# Patient Record
Sex: Female | Born: 1985 | Race: White | Hispanic: No | Marital: Married | Smoking: Current some day smoker
Health system: Southern US, Community
[De-identification: ages and names within clinical notes are randomized; demographics above are authoritative.]

## PROBLEM LIST (undated history)

## (undated) ENCOUNTER — Emergency Department (HOSPITAL_BASED_OUTPATIENT_CLINIC_OR_DEPARTMENT_OTHER): Discharge status: Absent without leave | Payer: Medicaid Other

## (undated) DIAGNOSIS — S8990XA Unspecified injury of unspecified lower leg, initial encounter: Secondary | ICD-10-CM

## (undated) DIAGNOSIS — F319 Bipolar disorder, unspecified: Secondary | ICD-10-CM

## (undated) HISTORY — PX: TUBAL LIGATION: SHX77

## (undated) HISTORY — PX: CHOLECYSTECTOMY: SHX55

## (undated) HISTORY — PX: BREAST SURGERY: SHX581

---
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
## 2004-08-24 ENCOUNTER — Other Ambulatory Visit: Admission: RE | Admit: 2004-08-24 | Discharge: 2004-08-24 | Payer: Self-pay | Admitting: Family Medicine

## 2005-03-20 ENCOUNTER — Ambulatory Visit: Payer: Self-pay | Admitting: Family Medicine

## 2005-05-07 ENCOUNTER — Ambulatory Visit: Payer: Self-pay | Admitting: Family Medicine

## 2006-03-20 ENCOUNTER — Ambulatory Visit: Payer: Self-pay | Admitting: Family Medicine

## 2018-08-07 ENCOUNTER — Encounter: Payer: Self-pay | Admitting: Emergency Medicine

## 2018-08-07 ENCOUNTER — Emergency Department (HOSPITAL_BASED_OUTPATIENT_CLINIC_OR_DEPARTMENT_OTHER)
Admission: EM | Admit: 2018-08-07 | Discharge: 2018-08-07 | Disposition: A | Discharge status: Home / Self Care | Payer: Medicaid Other | Attending: Emergency Medicine | Admitting: Emergency Medicine

## 2018-08-07 DIAGNOSIS — F319 Bipolar disorder, unspecified: Secondary | ICD-10-CM | POA: Insufficient documentation

## 2018-08-07 DIAGNOSIS — N898 Other specified noninflammatory disorders of vagina: Secondary | ICD-10-CM | POA: Diagnosis present

## 2018-08-07 DIAGNOSIS — F17200 Nicotine dependence, unspecified, uncomplicated: Secondary | ICD-10-CM | POA: Diagnosis not present

## 2018-08-07 DIAGNOSIS — Z9049 Acquired absence of other specified parts of digestive tract: Secondary | ICD-10-CM | POA: Diagnosis not present

## 2018-08-07 HISTORY — DX: Bipolar disorder, unspecified: F31.9

## 2018-08-07 LAB — URINALYSIS, ROUTINE W REFLEX MICROSCOPIC
Bilirubin Urine: NEGATIVE
GLUCOSE, UA: NEGATIVE mg/dL
HGB URINE DIPSTICK: NEGATIVE
KETONES UR: 15 mg/dL — AB
Nitrite: NEGATIVE
PH: 7 (ref 5.0–8.0)
Protein, ur: NEGATIVE mg/dL
Specific Gravity, Urine: 1.015 (ref 1.005–1.030)

## 2018-08-07 LAB — PREGNANCY, URINE: PREG TEST UR: NEGATIVE

## 2018-08-07 LAB — WET PREP, GENITAL
CLUE CELLS WET PREP: NONE SEEN
Sperm: NONE SEEN
Trich, Wet Prep: NONE SEEN
Yeast Wet Prep HPF POC: NONE SEEN

## 2018-08-07 LAB — URINALYSIS, MICROSCOPIC (REFLEX)

## 2018-08-07 NOTE — Discharge Instructions (Signed)
Someone will call you in a week with the results of her gonorrhea and Chlamydia test.  If you do not hear back you can call the emergency department to follow-up or call your primary care doctor.  If you continue to have vaginal discharge or increased pain please follow-up with your primary doctor or come back to the emergency room.

## 2018-08-07 NOTE — ED Triage Notes (Signed)
PResents with heavy vaginal discharge that began a week ago, she reports that she was treated 2 weeks ago for BV.  Denies abdominal pain and dysuria.

## 2018-08-07 NOTE — ED Provider Notes (Signed)
[facility] EMERGENCY DEPARTMENT Provider Note   CSN: [id number] Arrival date & time: 08/07/18  2058     History   Chief Complaint Chief Complaint  Patient presents with  . Vaginal Discharge    HPI Deborah GurneyHeather Hardy is a 33 y.o. female.  Patient is a 33 year old female with no significant past medical history presenting for vaginal discharge x1 week.  Patient reports the discharge is white, thick, and without odor.  Patient notes some vaginal itching but this improved when she started using Monistat cream.  Patient has a history of recent bacterial vaginosis infection 2 weeks ago for which she was given metronidazole.  Patient says she did not finish the metronidazole because she did not like the taste of the pills.  Says she has 7 days left of this medication.  Patient denies any urinary symptoms she has frequency, urgency, hematuria, dysuria.  Patient denies any vaginal bleeding.  Patient does get regular periods.  Patient is not on any hormonal contraception says she had a tubal ligation 10 years ago.  Patient is sexually active with one partner, most recent unprotected sexual intercourse was 1 week ago.  Patient reports some right sided pain and also pelvic pain.  Patient denies any fevers or chills.  Patient denies any nausea vomiting or diarrhea.  Patient does report a headache and lightheadedness that began today.  Patient recently switched to third shift and has not been sleeping well.  Along with this headache and lightheadedness patient is having vision changes of blurry vision.  Patient denies any alcohol use recently.  Patient does report half pack per day tobacco use.  Patient reports a daily marijuana use most recent was 1 hour ago.     Past Medical History:  Diagnosis Date  . Bipolar 1 disorder (HCC)     There are no active problems to display for this patient.   Past Surgical History:  Procedure Laterality Date  . CHOLECYSTECTOMY    . TUBAL LIGATION       OB  History   None      Home Medications    Prior to Admission medications   Not on File    Family History History reviewed. No pertinent family history.  Social History Social History   Tobacco Use  . Smoking status: Current Every Day Smoker  . Smokeless tobacco: Never Used  Substance Use Topics  . Alcohol use: Yes  . Drug use: Yes    Types: Marijuana     Allergies   Patient has no known allergies.   Review of Systems Review of Systems  Constitutional: Negative for chills and fever.  Eyes: Positive for visual disturbance.  Genitourinary: Positive for pelvic pain and vaginal discharge. Negative for dysuria, flank pain, frequency, hematuria, urgency, vaginal bleeding and vaginal pain.  Musculoskeletal: Positive for back pain and myalgias.  Neurological: Positive for dizziness and headaches.     Physical Exam Updated Vital Signs BP 112/70   Pulse 71   Temp 98.4 F (36.9 C) (Oral)   Resp 16   Ht 5' 1.5" (1.562 m)   Wt 56.2 kg   SpO2 100%   BMI 23.05 kg/m   Physical Exam  Constitutional: She appears well-developed. No distress.  HENT:  Head: Normocephalic.  Mouth/Throat: Oropharynx is clear and moist.  Eyes: Pupils are equal, round, and reactive to light. Conjunctivae and EOM are normal. Right eye exhibits no discharge. Left eye exhibits no discharge. No scleral icterus.  Neck: Normal range of motion.  Cardiovascular: Normal rate, regular rhythm, normal heart sounds and intact distal pulses. Exam reveals no gallop and no friction rub.  No murmur heard. Pulmonary/Chest: Effort normal and breath sounds normal. No respiratory distress. She has no wheezes. She has no rales.  Abdominal: Soft. Bowel sounds are normal. There is no tenderness.  Genitourinary: Uterus normal. Pelvic exam was performed with patient supine. Cervix exhibits no motion tenderness and no friability. Right adnexum displays no mass and no tenderness. Left adnexum displays no mass and no  tenderness. Vaginal discharge found.  Genitourinary Comments: Thick vaginal discharge reminiscent of cream  Musculoskeletal: She exhibits no edema or tenderness.  Neurological: She is alert. No cranial nerve deficit or sensory deficit. She exhibits normal muscle tone. Coordination normal.  No finger-to-nose dysmetria  Skin: Skin is warm. No rash noted.  Psychiatric: She has a normal mood and affect.  Vitals reviewed.    ED Treatments / Results  Labs (all labs ordered are listed, but only abnormal results are displayed) Labs Reviewed  WET PREP, GENITAL - Abnormal; Notable for the following components:      Result Value   WBC, Wet Prep HPF POC MANY (*)    All other components within normal limits  URINALYSIS, ROUTINE W REFLEX MICROSCOPIC - Abnormal; Notable for the following components:   APPearance HAZY (*)    Ketones, ur 15 (*)    Leukocytes, UA TRACE (*)    All other components within normal limits  URINALYSIS, MICROSCOPIC (REFLEX) - Abnormal; Notable for the following components:   Bacteria, UA FEW (*)    All other components within normal limits  PREGNANCY, URINE  GC/CHLAMYDIA PROBE AMP ([place]) NOT AT [facility]    EKG None  Radiology No results found.  Procedures Procedures (including critical care time)  Medications Ordered in ED Medications - No data to display   Initial Impression / Assessment and Plan / ED Course  I have reviewed the triage vital signs and the nursing notes.  Pertinent labs & imaging results that were available during my care of the patient were reviewed by me and considered in my medical decision making (see chart for details).     Patient is a 33 year old female presenting with 1 week of vaginal discharge.  No red flag symptoms present.  Discharge noted on pelvic exam appears to be cream, patient does report using Monistat cream today.  Likely that patient continues to have bacterial vaginosis as she has not completed treatment and has 7  days left of antibiotics.  Cannot rule out gonorrhea and chlamydia given that patient has recent unprotected sexual intercourse.  Will obtain wet prep as well as gonorrhea and Chlamydia testing.  Patient without cervical motion tenderness.  10:33PM: Wet prep negative for Trichomonas, yeast, clue cells.  Gonorrhea and Chlamydia test pending.  Patient chose not to have treatment today and would like to wait for test results.  Patient stable for discharge home.  Return precautions given.  Stable for discharge.  Discussed patient with Dr. Particia Nearing who independently examined patient and agrees with plan.   Final Clinical Impressions(s) / ED Diagnoses   Final diagnoses:  None    ED Discharge Orders    None       Oralia Manis, DO 08/07/18 2234    Jacalyn Lefevre, MD 08/07/18 2320

## 2018-08-11 LAB — GC/CHLAMYDIA PROBE AMP (~~LOC~~) NOT AT ARMC
CHLAMYDIA, DNA PROBE: NEGATIVE
NEISSERIA GONORRHEA: NEGATIVE

## 2018-09-02 ENCOUNTER — Emergency Department (HOSPITAL_BASED_OUTPATIENT_CLINIC_OR_DEPARTMENT_OTHER)
Admission: EM | Admit: 2018-09-02 | Discharge: 2018-09-03 | Disposition: A | Discharge status: Home / Self Care | Payer: Medicaid Other | Attending: Emergency Medicine | Admitting: Emergency Medicine

## 2018-09-02 ENCOUNTER — Encounter (HOSPITAL_BASED_OUTPATIENT_CLINIC_OR_DEPARTMENT_OTHER): Payer: Self-pay | Admitting: Emergency Medicine

## 2018-09-02 ENCOUNTER — Other Ambulatory Visit: Payer: Self-pay

## 2018-09-02 ENCOUNTER — Emergency Department (HOSPITAL_BASED_OUTPATIENT_CLINIC_OR_DEPARTMENT_OTHER): Payer: Medicaid Other

## 2018-09-02 DIAGNOSIS — Y93E8 Activity, other personal hygiene: Secondary | ICD-10-CM | POA: Insufficient documentation

## 2018-09-02 DIAGNOSIS — Y92012 Bathroom of single-family (private) house as the place of occurrence of the external cause: Secondary | ICD-10-CM | POA: Insufficient documentation

## 2018-09-02 DIAGNOSIS — W19XXXA Unspecified fall, initial encounter: Secondary | ICD-10-CM

## 2018-09-02 DIAGNOSIS — M549 Dorsalgia, unspecified: Secondary | ICD-10-CM | POA: Diagnosis not present

## 2018-09-02 DIAGNOSIS — W208XXA Other cause of strike by thrown, projected or falling object, initial encounter: Secondary | ICD-10-CM | POA: Diagnosis not present

## 2018-09-02 DIAGNOSIS — S29012A Strain of muscle and tendon of back wall of thorax, initial encounter: Secondary | ICD-10-CM | POA: Diagnosis not present

## 2018-09-02 DIAGNOSIS — Y999 Unspecified external cause status: Secondary | ICD-10-CM | POA: Insufficient documentation

## 2018-09-02 DIAGNOSIS — W01198A Fall on same level from slipping, tripping and stumbling with subsequent striking against other object, initial encounter: Secondary | ICD-10-CM | POA: Insufficient documentation

## 2018-09-02 DIAGNOSIS — M546 Pain in thoracic spine: Secondary | ICD-10-CM

## 2018-09-02 DIAGNOSIS — S299XXA Unspecified injury of thorax, initial encounter: Secondary | ICD-10-CM | POA: Diagnosis present

## 2018-09-02 DIAGNOSIS — F17200 Nicotine dependence, unspecified, uncomplicated: Secondary | ICD-10-CM | POA: Insufficient documentation

## 2018-09-02 DIAGNOSIS — T148XXA Other injury of unspecified body region, initial encounter: Secondary | ICD-10-CM

## 2018-09-02 MED ORDER — KETOROLAC TROMETHAMINE 15 MG/ML IJ SOLN
15.0000 mg | Freq: Once | INTRAMUSCULAR | Status: AC
  Administered 2018-09-02: 15 mg via INTRAMUSCULAR
  Filled 2018-09-02: qty 1

## 2018-09-02 NOTE — ED Notes (Signed)
ED Provider at bedside. 

## 2018-09-02 NOTE — ED Triage Notes (Signed)
Pt c/o back pain s/p falling in shower this morning; sts ceiling fell in on her, causing her to fall

## 2018-09-03 MED ORDER — NAPROXEN 500 MG PO TABS: 500.0000 mg | ORAL_TABLET | Freq: Two times a day (BID) | ORAL | 0 refills | Status: DC

## 2018-09-03 NOTE — Discharge Instructions (Signed)
Take naproxen 2 times a day with meals.  Do not take other anti-inflammatories at the same time (Advil, Motrin, ibuprofen, Aleve). You may supplement with Tylenol if you need further pain control. Use ice packs or heating pads if this helps control your pain. Use muscle creams (salon pas, icy hot, bengay) for pain.  You will likely have continued muscle stiffness and soreness over the next couple days.  Follow-up with primary care in 1 week if your symptoms are not improving. Return to the emergency room if you develop numbness, loss of bowel or bladder control, inability to walk, or any new or worsening symptoms.

## 2018-09-03 NOTE — ED Provider Notes (Signed)
[facility] EMERGENCY DEPARTMENT Provider Note   CSN: [id number] Arrival date & time: 09/02/18  1954     History   Chief Complaint Chief Complaint  Patient presents with  . Back Pain    HPI Deborah Hardy is a 33 y.o. female for evaluation of back pain.  Patient states she was in the shower this morning when the ceiling fell in on her, causing her to fall backwards and landed on her back.  She thinks she hit her mid back stool in her bathroom.  She denies hitting her head or loss of consciousness.  Since the fall, she has been having increasing back pain.  She has taken Tylenol without improvement of her symptoms.  She has not tried anything else.  She denies headaches, vision changes, slurred speech, neck pain, or pain of the extremities.  She denies numbness or tingling.  She denies loss of bowel or bladder control.  Patient states she has no other medical problems, takes no medications daily.  It is constant, movement makes it worse, nothing makes it better.  No radiation of the pain.  HPI  Past Medical History:  Diagnosis Date  . Bipolar 1 disorder (HCC)     There are no active problems to display for this patient.   Past Surgical History:  Procedure Laterality Date  . CHOLECYSTECTOMY    . TUBAL LIGATION       OB History   None      Home Medications    Prior to Admission medications   Medication Sig Start Date End Date Taking? Authorizing Provider  naproxen (NAPROSYN) 500 MG tablet Take 1 tablet (500 mg total) by mouth 2 (two) times daily with a meal. 09/03/18   Yarithza Mink, PA-C    Family History History reviewed. No pertinent family history.  Social History Social History   Tobacco Use  . Smoking status: Current Every Day Smoker  . Smokeless tobacco: Never Used  Substance Use Topics  . Alcohol use: Yes  . Drug use: Yes    Types: Marijuana     Allergies   Patient has no known allergies.   Review of Systems Review of Systems    Musculoskeletal: Positive for back pain.  Neurological: Negative for numbness.     Physical Exam Updated Vital Signs BP (!) 91/58 (BP Location: Left Arm)   Pulse 66   Temp 98.1 F (36.7 C) (Oral)   Resp 16   Ht 5\' 1"  (1.549 m)   Wt 54.4 kg   SpO2 100%   BMI 22.67 kg/m   Physical Exam  Constitutional: She is oriented to person, place, and time. She appears well-developed and well-nourished. No distress.  Resting comfortably in the bed in no acute distress  HENT:  Head: Normocephalic and atraumatic.  Eyes: EOM are normal.  Neck: Normal range of motion.  Cardiovascular: Normal rate, regular rhythm and intact distal pulses.  Pulmonary/Chest: Effort normal and breath sounds normal. No respiratory distress. She has no wheezes.  Abdominal: Soft. She exhibits no distension. There is no tenderness.  Musculoskeletal: Normal range of motion. She exhibits tenderness. She exhibits no edema or deformity.  Tenderness palpation of mid thoracic back along bilateral back musculature and midline spine.  No tenderness palpation also in the back.  No step-offs or deformities.  Strength intact x4.  Sensation intact x4.  Radial and pedal pulses intact bilaterally.  Full active range of motion of upper and lower extremities without difficulty.  No saddle paresthesias  Neurological: She is alert and oriented to person, place, and time. No sensory deficit.  Skin: Skin is warm. Capillary refill takes less than 2 seconds. No rash noted.  Psychiatric: She has a normal mood and affect.  Nursing note and vitals reviewed.    ED Treatments / Results  Labs (all labs ordered are listed, but only abnormal results are displayed) Labs Reviewed - No data to display  EKG None  Radiology Dg Chest 2 View  Result Date: 09/03/2018 CLINICAL DATA:  Bilateral shoulder pain and low back pain after a fall this morning. Smoker. EXAM: CHEST - 2 VIEW COMPARISON:  None. FINDINGS: The heart size and mediastinal contours  are within normal limits. Both lungs are clear. The visualized skeletal structures are unremarkable. Surgical clips in the right upper quadrant. IMPRESSION: No active cardiopulmonary disease. Electronically Signed   By: Burman Nieves M.D.   On: 09/03/2018 00:00   Dg Cervical Spine Complete  Result Date: 09/02/2018 CLINICAL DATA:  Neck and back pain after fall EXAM: CERVICAL SPINE - COMPLETE 4+ VIEW COMPARISON:  None. FINDINGS: Cervical alignment within normal limits. Mild degenerative change at C3-C4. Vertebral body heights are normal. Dens and lateral masses are within normal limits. Normal prevertebral soft tissue thickness. IMPRESSION: No acute osseous abnormality.  Minimal degenerative change at C3-C4. Electronically Signed   By: Jasmine Pang M.D.   On: 09/02/2018 23:52    Procedures Procedures (including critical care time)  Medications Ordered in ED Medications  ketorolac (TORADOL) 15 MG/ML injection 15 mg (15 mg Intramuscular Given 09/02/18 2255)     Initial Impression / Assessment and Plan / ED Course  I have reviewed the triage vital signs and the nursing notes.  Pertinent labs & imaging results that were available during my care of the patient were reviewed by me and considered in my medical decision making (see chart for details).     Pt presenting for evaluation of back pain after fall.  Physical exam reassuring, no obvious neurologic deficits.  Tenderness palpation of mid thoracic back.  Pain is reproducible with palpation of the musculature, however patient also has pain over midline spine.  Will obtain x-rays for further evaluation.  Toradol given for pain.  On reassessment, patient reports pain is improving.  X-rays read interpreted by me, no fractures or dislocations.  No obvious lung injury.  Discussed with patient.  Discussed typical course of muscle stiffness.  Doubt spinal cord injury, depression, myelopathy.  Doubt cauda equina syndrome.  Discussed treatment with  NSAIDs, muscle creams, heat/ice.  Discussed follow-up with PCP as needed for further evaluation.  At this time, patient appears safe for discharge.  Return precautions given.  Patient states she understands and agrees plan.   Final Clinical Impressions(s) / ED Diagnoses   Final diagnoses:  Fall, initial encounter  Acute bilateral thoracic back pain  Muscle strain    ED Discharge Orders         Ordered    naproxen (NAPROSYN) 500 MG tablet  2 times daily with meals     09/03/18 0011           Tryone Kille, PA-C 09/03/18 0017    Vanetta Mulders, MD 09/09/18 [id number]

## 2018-10-05 ENCOUNTER — Emergency Department (HOSPITAL_BASED_OUTPATIENT_CLINIC_OR_DEPARTMENT_OTHER)
Admission: EM | Admit: 2018-10-05 | Discharge: 2018-10-05 | Disposition: A | Discharge status: Home / Self Care | Payer: Medicaid Other | Attending: Emergency Medicine | Admitting: Emergency Medicine

## 2018-10-05 ENCOUNTER — Other Ambulatory Visit: Payer: Self-pay

## 2018-10-05 ENCOUNTER — Encounter (HOSPITAL_BASED_OUTPATIENT_CLINIC_OR_DEPARTMENT_OTHER): Payer: Self-pay | Admitting: Emergency Medicine

## 2018-10-05 DIAGNOSIS — R110 Nausea: Secondary | ICD-10-CM | POA: Diagnosis not present

## 2018-10-05 DIAGNOSIS — R0981 Nasal congestion: Secondary | ICD-10-CM | POA: Insufficient documentation

## 2018-10-05 DIAGNOSIS — F1210 Cannabis abuse, uncomplicated: Secondary | ICD-10-CM | POA: Diagnosis not present

## 2018-10-05 DIAGNOSIS — B9789 Other viral agents as the cause of diseases classified elsewhere: Secondary | ICD-10-CM | POA: Diagnosis not present

## 2018-10-05 DIAGNOSIS — F17200 Nicotine dependence, unspecified, uncomplicated: Secondary | ICD-10-CM | POA: Diagnosis not present

## 2018-10-05 DIAGNOSIS — R05 Cough: Secondary | ICD-10-CM | POA: Diagnosis present

## 2018-10-05 DIAGNOSIS — J069 Acute upper respiratory infection, unspecified: Secondary | ICD-10-CM | POA: Diagnosis not present

## 2018-10-05 NOTE — ED Triage Notes (Signed)
Pt states she has had a cough, hot flashes then cold chills and nausea  Sxs started 3 days ago

## 2018-10-05 NOTE — ED Provider Notes (Signed)
[facility] EMERGENCY DEPARTMENT Provider Note   CSN: [id number] Arrival date & time: 10/05/18  2138     History   Chief Complaint Chief Complaint  Patient presents with  . Cough  . Nausea    HPI Deborah Hardy is a 33 y.o. female.  Patient c/o non productive cough and nasal congestion in the past 2-3 days. Symptoms moderate, persistent. No sore throat. No trouble breathing or swallowing. No fevers. No chest pain. No nvd. No abd pain. No rash. No headaches. Family member w similar symptoms, no other known ill contacts.   The history is provided by the patient.  Cough  Pertinent negatives include no chest pain, no headaches, no sore throat, no shortness of breath and no eye redness.    Past Medical History:  Diagnosis Date  . Bipolar 1 disorder (HCC)     There are no active problems to display for this patient.   Past Surgical History:  Procedure Laterality Date  . BREAST SURGERY    . CHOLECYSTECTOMY    . TUBAL LIGATION       OB History   None      Home Medications    Prior to Admission medications   Medication Sig Start Date End Date Taking? Authorizing Provider  naproxen (NAPROSYN) 500 MG tablet Take 1 tablet (500 mg total) by mouth 2 (two) times daily with a meal. 09/03/18   Caccavale, Sophia, PA-C    Family History History reviewed. No pertinent family history.  Social History Social History   Tobacco Use  . Smoking status: Current Every Day Smoker  . Smokeless tobacco: Never Used  Substance Use Topics  . Alcohol use: Yes  . Drug use: Yes    Types: Marijuana     Allergies   Patient has no known allergies.   Review of Systems Review of Systems  Constitutional: Negative for fever.  HENT: Positive for congestion. Negative for sore throat.   Eyes: Negative for discharge and redness.  Respiratory: Positive for cough. Negative for shortness of breath.   Cardiovascular: Negative for chest pain.  Gastrointestinal: Negative for  abdominal pain and vomiting.  Genitourinary: Negative for flank pain.  Musculoskeletal: Negative for neck pain and neck stiffness.  Skin: Negative for rash.  Neurological: Negative for headaches.  Hematological: Does not bruise/bleed easily.  Psychiatric/Behavioral: Negative for confusion.     Physical Exam Updated Vital Signs BP (!) 95/59 (BP Location: Left Arm)   Pulse 75   Temp 98.4 F (36.9 C) (Oral)   Resp 16   Ht 1.562 m (5' 1.5")   Wt 54.4 kg   LMP 10/02/2018 (Exact Date)   SpO2 100%   BMI 22.31 kg/m   Physical Exam  Constitutional: She appears well-developed and well-nourished.  HENT:  Mouth/Throat: Oropharynx is clear and moist.  Nasal congestion. tms normal.   Eyes: Conjunctivae are normal. No scleral icterus.  Neck: Neck supple. No tracheal deviation present.  No stiffness or rigidity  Cardiovascular: Normal rate, regular rhythm, normal heart sounds and intact distal pulses. Exam reveals no gallop and no friction rub.  No murmur heard. Pulmonary/Chest: Effort normal and breath sounds normal. No respiratory distress.  Abdominal: Normal appearance. She exhibits no distension. There is no tenderness.  Musculoskeletal: She exhibits no edema.  Lymphadenopathy:    She has no cervical adenopathy.  Neurological: She is alert.  Skin: Skin is warm and dry. No rash noted.  Psychiatric: She has a normal mood and affect.  Nursing note and  vitals reviewed.    ED Treatments / Results  Labs (all labs ordered are listed, but only abnormal results are displayed) Labs Reviewed - No data to display  EKG None  Radiology No results found.  Procedures Procedures (including critical care time)  Medications Ordered in ED Medications - No data to display   Initial Impression / Assessment and Plan / ED Course  I have reviewed the triage vital signs and the nursing notes.  Pertinent labs & imaging results that were available during my care of the patient were  reviewed by me and considered in my medical decision making (see chart for details).  Pts exam/symptoms felt most c/w viral uri.  Pt currently appears stable for d/c.     Final Clinical Impressions(s) / ED Diagnoses   Final diagnoses:  None    ED Discharge Orders    None       Cathren Laine, MD 10/05/18 2253

## 2018-10-05 NOTE — Discharge Instructions (Signed)
It was our pleasure to provide your ER care today - we hope that you feel better.  Rest. Drink plenty of fluids.  Take mucinex or robitussin as need for cough and congestion.  Follow up with primary care doctor in 1 week if symptoms fail to improve/resolve.  Return to ER if worse, new symptoms, increased trouble breathing, other concern.

## 2019-02-06 ENCOUNTER — Emergency Department (HOSPITAL_BASED_OUTPATIENT_CLINIC_OR_DEPARTMENT_OTHER)
Admission: EM | Admit: 2019-02-06 | Discharge: 2019-02-06 | Disposition: A | Discharge status: Home / Self Care | Payer: Medicaid Other | Attending: Emergency Medicine | Admitting: Emergency Medicine

## 2019-02-06 ENCOUNTER — Encounter (HOSPITAL_BASED_OUTPATIENT_CLINIC_OR_DEPARTMENT_OTHER): Payer: Self-pay | Admitting: Adult Health

## 2019-02-06 DIAGNOSIS — F17200 Nicotine dependence, unspecified, uncomplicated: Secondary | ICD-10-CM | POA: Diagnosis not present

## 2019-02-06 DIAGNOSIS — F1210 Cannabis abuse, uncomplicated: Secondary | ICD-10-CM | POA: Diagnosis not present

## 2019-02-06 DIAGNOSIS — R309 Painful micturition, unspecified: Secondary | ICD-10-CM | POA: Insufficient documentation

## 2019-02-06 DIAGNOSIS — N898 Other specified noninflammatory disorders of vagina: Secondary | ICD-10-CM | POA: Insufficient documentation

## 2019-02-06 LAB — URINALYSIS, ROUTINE W REFLEX MICROSCOPIC
BILIRUBIN URINE: NEGATIVE
Glucose, UA: NEGATIVE mg/dL
Hgb urine dipstick: NEGATIVE
KETONES UR: NEGATIVE mg/dL
NITRITE: NEGATIVE
PH: 7 (ref 5.0–8.0)
PROTEIN: NEGATIVE mg/dL
Specific Gravity, Urine: 1.015 (ref 1.005–1.030)

## 2019-02-06 LAB — WET PREP, GENITAL
Clue Cells Wet Prep HPF POC: NONE SEEN
SPERM: NONE SEEN
Yeast Wet Prep HPF POC: NONE SEEN

## 2019-02-06 LAB — URINALYSIS, MICROSCOPIC (REFLEX)

## 2019-02-06 LAB — PREGNANCY, URINE: PREG TEST UR: NEGATIVE

## 2019-02-06 MED ORDER — AZITHROMYCIN 250 MG PO TABS
1000.0000 mg | ORAL_TABLET | Freq: Once | ORAL | Status: AC
  Administered 2019-02-06: 1000 mg via ORAL
  Filled 2019-02-06: qty 4

## 2019-02-06 MED ORDER — OSELTAMIVIR PHOSPHATE 75 MG PO CAPS
75.0000 mg | ORAL_CAPSULE | Freq: Once | ORAL | Status: DC
Start: 2019-02-06 — End: 2019-02-06

## 2019-02-06 MED ORDER — METRONIDAZOLE 500 MG PO TABS
2000.0000 mg | ORAL_TABLET | Freq: Once | ORAL | Status: AC
  Administered 2019-02-06: 2000 mg via ORAL
  Filled 2019-02-06: qty 4

## 2019-02-06 MED ORDER — CEFTRIAXONE SODIUM 250 MG IJ SOLR
250.0000 mg | Freq: Once | INTRAMUSCULAR | Status: AC
  Administered 2019-02-06: 250 mg via INTRAMUSCULAR
  Filled 2019-02-06: qty 250

## 2019-02-06 NOTE — ED Notes (Signed)
C/o vaginal dc x 1 week  Denies any pain

## 2019-02-06 NOTE — ED Triage Notes (Signed)
PT reports vaginal discharge.  The discharge has been for one week. Denies pain in abdomen.

## 2019-02-06 NOTE — ED Provider Notes (Signed)
[facility] EMERGENCY DEPARTMENT Provider Note   CSN: [id number] Arrival date & time: 02/06/19  [id number]     History   Chief Complaint Chief Complaint  Patient presents with  . Vaginal Discharge    HPI Deborah Hardy is a 34 y.o. female presents to emergency department today with chief complaint of vaginal discharge.  She reports symptoms have been present x1 week.  She describes the discharge as thin, with a fishy odor, and a gray color.  She also reports burning with urination.  She has tried drinking cranberry juice, but it did not relieve her symptoms.  Patient has a history of bacterial vaginosis.  She reports this feels similar. She denies any associated pain.  She has not taken anything for symptoms.  Denies fever, abdominal pain, nausea, vomiting, pelvic pain, dyspareunia, rash, genital lesions. She is also concerned for STDs.  She admits to one female partner.    Past Medical History:  Diagnosis Date  . Bipolar 1 disorder (HCC)     There are no active problems to display for this patient.   Past Surgical History:  Procedure Laterality Date  . BREAST SURGERY    . CHOLECYSTECTOMY    . TUBAL LIGATION       OB History   No obstetric history on file.      Home Medications    Prior to Admission medications   Medication Sig Start Date End Date Taking? Authorizing Provider  naproxen (NAPROSYN) 500 MG tablet Take 1 tablet (500 mg total) by mouth 2 (two) times daily with a meal. 09/03/18   Caccavale, Sophia, PA-C    Family History History reviewed. No pertinent family history.  Social History Social History   Tobacco Use  . Smoking status: Current Every Day Smoker  . Smokeless tobacco: Never Used  Substance Use Topics  . Alcohol use: Yes  . Drug use: Yes    Types: Marijuana     Allergies   Patient has no known allergies.   Review of Systems Review of Systems  Constitutional: Negative for chills and fever.  HENT: Negative for congestion, sinus  pressure and sore throat.   Eyes: Negative for pain and visual disturbance.  Respiratory: Negative for chest tightness and shortness of breath.   Cardiovascular: Negative for chest pain and palpitations.  Gastrointestinal: Negative for abdominal pain, diarrhea, nausea and vomiting.  Genitourinary: Positive for vaginal discharge. Negative for difficulty urinating, dyspareunia, genital sores, hematuria, pelvic pain and vaginal bleeding.  Musculoskeletal: Negative for back pain and neck pain.  Skin: Negative for rash and wound.  Neurological: Negative for syncope and headaches.     Physical Exam Updated Vital Signs BP (!) 103/50 (BP Location: Left Arm)   Pulse 72   Temp 98.4 F (36.9 C) (Oral)   Resp 16   Ht 5\' 1"  (1.549 m)   Wt 51 kg   LMP 01/19/2019 (Approximate)   SpO2 100%   BMI 21.24 kg/m   Physical Exam Vitals signs and nursing note reviewed.  Constitutional:      Appearance: She is not ill-appearing or toxic-appearing.  HENT:     Head: Normocephalic and atraumatic.     Nose: Nose normal.     Mouth/Throat:     Mouth: Mucous membranes are moist.     Pharynx: Oropharynx is clear.  Eyes:     General: No scleral icterus.    Conjunctiva/sclera: Conjunctivae normal.  Neck:     Musculoskeletal: Normal range of motion.  Cardiovascular:  Rate and Rhythm: Normal rate and regular rhythm.     Pulses: Normal pulses.     Heart sounds: Normal heart sounds.  Pulmonary:     Effort: Pulmonary effort is normal.     Breath sounds: Normal breath sounds.  Abdominal:     General: There is no distension.     Palpations: Abdomen is soft.     Tenderness: There is no abdominal tenderness. There is no guarding or rebound.  Genitourinary:    Comments: Exam chaperoned by RN. Pelvic exam: normal external genitalia without evidence of trauma. VULVA: normal appearing vulva with no masses, tenderness or lesion. VAGINA: normal appearing vagina with normal color and discharge, no  lesions. CERVIX: normal appearing cervix without lesions, cervical motion tenderness absent, cervical os closed without purulent discharge; vaginal discharge thin and white, Wet prep and DNA probe for chlamydia and GC obtained.   ADNEXA: normal adnexa in size, nontender and no masses   Musculoskeletal: Normal range of motion.  Skin:    General: Skin is warm and dry.     Capillary Refill: Capillary refill takes less than 2 seconds.  Neurological:     Mental Status: She is alert. Mental status is at baseline.     Motor: No weakness.  Psychiatric:        Behavior: Behavior normal.      ED Treatments / Results  Labs (all labs ordered are listed, but only abnormal results are displayed) Labs Reviewed  WET PREP, GENITAL - Abnormal; Notable for the following components:      Result Value   Trich, Wet Prep PRESENT (*)    WBC, Wet Prep HPF POC MODERATE (*)    All other components within normal limits  URINALYSIS, ROUTINE W REFLEX MICROSCOPIC - Abnormal; Notable for the following components:   Leukocytes, UA MODERATE (*)    All other components within normal limits  URINALYSIS, MICROSCOPIC (REFLEX) - Abnormal; Notable for the following components:   Bacteria, UA MANY (*)    All other components within normal limits  URINE CULTURE  PREGNANCY, URINE  RPR  HIV ANTIBODY (ROUTINE TESTING W REFLEX)  GC/CHLAMYDIA PROBE AMP (Center) NOT [facility]    EKG None  Radiology No results found.  Procedures Procedures (including critical care time)  Medications Ordered in ED Medications  cefTRIAXone (ROCEPHIN) injection 250 mg (has no administration in time range)  azithromycin (ZITHROMAX) tablet 1,000 mg (has no administration in time range)  metroNIDAZOLE (FLAGYL) tablet 2,000 mg (has no administration in time range)     Initial Impression / Assessment and Plan / ED Course  I have reviewed the triage vital signs and the nursing notes.  Pertinent labs & imaging results that were  available during my care of the patient were reviewed by me and considered in my medical decision making (see chart for details).   Pt is afebrile, well appearing. Pt presents with concerns for possible STD.  Pt understands that she has GC/Chlamydia cultures pending and that they will need to inform all sexual partners if results return positive. Pt has been treated prophylactically with azithromycin and Rocephin due to pts history, pelvic exam.Wet prep positive for trichomoniasis and moderate white blood cells, treated with metronidazole. Pt not concerning for PID because hemodynamically stable and no cervical motion tenderness on pelvic exam.   UA with moderate leukocytes, 21-50 WBC, and many bacteria. Will send for culture. Patient to be discharged with instructions to follow up with OBGYN/PCP. Discussed importance of using protection  when sexually active. Discussed strict ED return precautions. Pt verbalized understanding of and is in agreement with this plan. Pt stable for discharge home at this time.   Final Clinical Impressions(s) / ED Diagnoses   Final diagnoses:  Vaginal discharge    ED Discharge Orders    None       Sherene Sires, PA-C 02/07/19 0036    Gwyneth Sprout, MD 02/08/19 [phone]

## 2019-02-06 NOTE — Discharge Instructions (Signed)
Your STD testing was positive for Trichomoniasis. We have treated you for that and Gonorrhea, and Chlamydia.  1. Medications: usual home medications 2. Treatment: rest, drink plenty of fluids, use a condom with every sexual encounter 3. Follow Up: Please followup with your primary doctor in 3 days for discussion of your diagnoses and further evaluation after today's visit; if you do not have a primary care doctor use the resource guide provided to find one; You can also follow up with the health department.  Please return to the ER for worsening symptoms, high fevers or persistent vomiting.  You have been tested for HIV, syphilis, chlamydia and gonorrhea.  These results will be available in approximately 3 days.  Please inform all sexual partners if you test positive for any of these diseases.

## 2019-02-07 LAB — RPR: RPR: NONREACTIVE

## 2019-02-07 LAB — HIV ANTIBODY (ROUTINE TESTING W REFLEX): HIV Screen 4th Generation wRfx: NONREACTIVE

## 2019-02-08 LAB — URINE CULTURE: Culture: >=100000 — AB

## 2019-02-09 ENCOUNTER — Other Ambulatory Visit: Payer: Self-pay

## 2019-02-09 ENCOUNTER — Telehealth: Payer: Self-pay

## 2019-02-09 ENCOUNTER — Emergency Department (HOSPITAL_COMMUNITY)
Admission: EM | Admit: 2019-02-09 | Discharge: 2019-02-09 | Discharge status: Against Medical Advice | Payer: Medicaid Other

## 2019-02-09 ENCOUNTER — Encounter (HOSPITAL_BASED_OUTPATIENT_CLINIC_OR_DEPARTMENT_OTHER): Payer: Self-pay | Admitting: *Deleted

## 2019-02-09 DIAGNOSIS — Y999 Unspecified external cause status: Secondary | ICD-10-CM | POA: Insufficient documentation

## 2019-02-09 DIAGNOSIS — S60222A Contusion of left hand, initial encounter: Secondary | ICD-10-CM | POA: Insufficient documentation

## 2019-02-09 DIAGNOSIS — S6992XA Unspecified injury of left wrist, hand and finger(s), initial encounter: Secondary | ICD-10-CM | POA: Diagnosis present

## 2019-02-09 DIAGNOSIS — S62636A Displaced fracture of distal phalanx of right little finger, initial encounter for closed fracture: Secondary | ICD-10-CM | POA: Diagnosis not present

## 2019-02-09 DIAGNOSIS — Y92838 Other recreation area as the place of occurrence of the external cause: Secondary | ICD-10-CM | POA: Diagnosis not present

## 2019-02-09 DIAGNOSIS — Y9389 Activity, other specified: Secondary | ICD-10-CM | POA: Diagnosis not present

## 2019-02-09 LAB — GC/CHLAMYDIA PROBE AMP (~~LOC~~) NOT AT ARMC
Chlamydia: NEGATIVE
NEISSERIA GONORRHEA: NEGATIVE

## 2019-02-09 NOTE — ED Triage Notes (Signed)
Pt called twice from triage with no answer

## 2019-02-09 NOTE — Progress Notes (Signed)
ED Antimicrobial Stewardship Positive Culture Follow Up   Deborah Hardy is an 34 y.o. female who presented to [facility] on 02/06/2019 with a chief complaint of  Chief Complaint  Patient presents with  . Vaginal Discharge    Recent Results (from the past 720 hour(s))  Wet prep, genital     Status: Abnormal   Collection Time: 02/06/19 11:09 AM  Result Value Ref Range Status   Yeast Wet Prep HPF POC NONE SEEN NONE SEEN Final   Trich, Wet Prep PRESENT (A) NONE SEEN Final   Clue Cells Wet Prep HPF POC NONE SEEN NONE SEEN Final   WBC, Wet Prep HPF POC MODERATE (A) NONE SEEN Final   Sperm NONE SEEN  Final    Comment: Performed at [facility], [address] [place], [place] [address]  Urine culture     Status: Abnormal   Collection Time: 02/06/19 12:06 PM  Result Value Ref Range Status   Specimen Description URINE, CLEAN CATCH  Final   Special Requests   Final    NONE Performed at [facility], [address] [place], [place] [address]    Culture (A)  Final    >=100,000 COLONIES/mL ESCHERICHIA COLI >=100,000 COLONIES/mL GROUP B STREP(S.AGALACTIAE)ISOLATED TESTING AGAINST S. AGALACTIAE NOT ROUTINELY PERFORMED DUE TO PREDICTABILITY OF AMP/PEN/VAN SUSCEPTIBILITY.    Report Status 02/08/2019 FINAL  Final   Organism ID, Bacteria ESCHERICHIA COLI (A)  Final      Susceptibility   Escherichia coli - MIC*    AMPICILLIN >=32 RESISTANT Resistant     CEFAZOLIN <=4 SENSITIVE Sensitive     CEFTRIAXONE <=1 SENSITIVE Sensitive     CIPROFLOXACIN >=4 RESISTANT Resistant     GENTAMICIN <=1 SENSITIVE Sensitive     IMIPENEM <=0.25 SENSITIVE Sensitive     NITROFURANTOIN <=16 SENSITIVE Sensitive     TRIMETH/SULFA <=20 SENSITIVE Sensitive     AMPICILLIN/SULBACTAM 16 INTERMEDIATE Intermediate     PIP/TAZO <=4 SENSITIVE Sensitive     Extended ESBL NEGATIVE Sensitive     * >=100,000 COLONIES/mL ESCHERICHIA COLI   Presented with vaginal discharge for 1 week in addition to  burning with urination. Received ceftraixone 250 mg IM, azithromycin 1 g PO, and metronidazole 2 g once. UA showing moderate leukocytes, 21-50 WBC, and many bacteria.   [x]  Patient discharged originally without antimicrobial agent and treatment is now indicated if remains symptomatic   New antibiotic prescription: ONLY IF REMAINS SYMPTOMATIC, cephalexin 500 mg every 12 hours for 5 days  ED Provider: Burna Forts, PA   Sherron Monday, PharmD, BCCCP Clinical Pharmacist  Pager: [phone] Phone: [phone] 02/09/2019, 9:19 AM Clinical Pharmacist Monday - Friday phone -  [phone] Saturday - Sunday phone - [phone]

## 2019-02-09 NOTE — ED Triage Notes (Signed)
States she has pain in her right 5th finger and left hand. States she does not know why she has the pain. Cannot remember if she sustained an injury.

## 2019-02-09 NOTE — Telephone Encounter (Signed)
Post ED Visit - Positive Culture Follow-up: Successful Patient Follow-Up  Culture assessed and recommendations reviewed by:  []  Enzo Bi, Pharm.D. []  Celedonio Miyamoto, Pharm.D., BCPS AQ-ID []  Garvin Fila, Pharm.D., BCPS []  Georgina Pillion, [address].D., BCPS []  Maria Antonia, [address].D., BCPS, AAHIVP []  Estella Husk, Pharm.D., BCPS, AAHIVP []  Lysle Pearl, PharmD, BCPS []  Phillips Climes, PharmD, BCPS []  Agapito Games, PharmD, BCPS []  Verlan Friends, PharmD Adline Mango D Positive urine culture  [x]  Patient discharged without antimicrobial prescription and treatment is now indicated []  Organism is resistant to prescribed ED discharge antimicrobial []  Patient with positive blood cultures  Changes discussed with ED provider: Eyvonne Mechanic [facility] New antibiotic prescription Cephalexin 500 mg BID x 5 days Called to [facility] [phone]  Contacted patient, date 02/09/2019, time 0925   Jerry Caras 02/09/2019, 9:23 AM

## 2019-02-10 ENCOUNTER — Emergency Department (HOSPITAL_BASED_OUTPATIENT_CLINIC_OR_DEPARTMENT_OTHER): Payer: Medicaid Other

## 2019-02-10 ENCOUNTER — Emergency Department (HOSPITAL_BASED_OUTPATIENT_CLINIC_OR_DEPARTMENT_OTHER)
Admission: EM | Admit: 2019-02-10 | Discharge: 2019-02-10 | Disposition: A | Discharge status: Home / Self Care | Payer: Medicaid Other | Attending: Emergency Medicine | Admitting: Emergency Medicine

## 2019-02-10 DIAGNOSIS — S60222A Contusion of left hand, initial encounter: Secondary | ICD-10-CM

## 2019-02-10 DIAGNOSIS — R52 Pain, unspecified: Secondary | ICD-10-CM

## 2019-02-10 DIAGNOSIS — S62636A Displaced fracture of distal phalanx of right little finger, initial encounter for closed fracture: Secondary | ICD-10-CM

## 2019-02-10 MED ORDER — HYDROCODONE-ACETAMINOPHEN 5-325 MG PO TABS
1.0000 | ORAL_TABLET | Freq: Once | ORAL | Status: AC
  Administered 2019-02-10: 1 via ORAL
  Filled 2019-02-10: qty 1

## 2019-02-10 MED ORDER — HYDROCODONE-ACETAMINOPHEN 5-325 MG PO TABS: 1.0000 | ORAL_TABLET | Freq: Four times a day (QID) | ORAL | 0 refills | Status: DC | PRN

## 2019-02-10 MED ORDER — IBUPROFEN 400 MG PO TABS
400.0000 mg | ORAL_TABLET | Freq: Once | ORAL | Status: AC
  Administered 2019-02-10: 400 mg via ORAL
  Filled 2019-02-10: qty 1

## 2019-02-10 NOTE — ED Notes (Signed)
PT states understanding of care given, follow up care, and medication prescribed. PT ambulated from ED to car with a steady gait. 
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
## 2019-02-10 NOTE — ED Notes (Signed)
Patient is A&Ox4.  No signs of distress noted.  Please see providers complete history and physical exam.  

## 2020-06-10 ENCOUNTER — Other Ambulatory Visit: Payer: Self-pay

## 2020-06-10 ENCOUNTER — Emergency Department (HOSPITAL_BASED_OUTPATIENT_CLINIC_OR_DEPARTMENT_OTHER)
Admission: EM | Admit: 2020-06-10 | Discharge: 2020-06-10 | Disposition: A | Discharge status: Home / Self Care | Payer: Medicaid Other | Attending: Emergency Medicine | Admitting: Emergency Medicine

## 2020-06-10 ENCOUNTER — Emergency Department (HOSPITAL_BASED_OUTPATIENT_CLINIC_OR_DEPARTMENT_OTHER): Payer: Medicaid Other

## 2020-06-10 ENCOUNTER — Encounter (HOSPITAL_BASED_OUTPATIENT_CLINIC_OR_DEPARTMENT_OTHER): Payer: Self-pay | Admitting: Emergency Medicine

## 2020-06-10 DIAGNOSIS — Y9372 Activity, wrestling: Secondary | ICD-10-CM | POA: Insufficient documentation

## 2020-06-10 DIAGNOSIS — R5383 Other fatigue: Secondary | ICD-10-CM | POA: Diagnosis not present

## 2020-06-10 DIAGNOSIS — W19XXXA Unspecified fall, initial encounter: Secondary | ICD-10-CM | POA: Diagnosis not present

## 2020-06-10 DIAGNOSIS — F17290 Nicotine dependence, other tobacco product, uncomplicated: Secondary | ICD-10-CM | POA: Insufficient documentation

## 2020-06-10 DIAGNOSIS — S46911A Strain of unspecified muscle, fascia and tendon at shoulder and upper arm level, right arm, initial encounter: Secondary | ICD-10-CM

## 2020-06-10 DIAGNOSIS — F1210 Cannabis abuse, uncomplicated: Secondary | ICD-10-CM | POA: Diagnosis not present

## 2020-06-10 DIAGNOSIS — Y999 Unspecified external cause status: Secondary | ICD-10-CM | POA: Diagnosis not present

## 2020-06-10 DIAGNOSIS — M791 Myalgia, unspecified site: Secondary | ICD-10-CM | POA: Insufficient documentation

## 2020-06-10 DIAGNOSIS — J069 Acute upper respiratory infection, unspecified: Secondary | ICD-10-CM | POA: Diagnosis not present

## 2020-06-10 DIAGNOSIS — R05 Cough: Secondary | ICD-10-CM | POA: Diagnosis not present

## 2020-06-10 DIAGNOSIS — R110 Nausea: Secondary | ICD-10-CM | POA: Insufficient documentation

## 2020-06-10 DIAGNOSIS — Y929 Unspecified place or not applicable: Secondary | ICD-10-CM | POA: Diagnosis not present

## 2020-06-10 MED ORDER — BENZONATATE 100 MG PO CAPS: 100.0000 mg | ORAL_CAPSULE | Freq: Three times a day (TID) | ORAL | 0 refills | Status: DC

## 2020-06-10 MED ORDER — DICLOFENAC SODIUM 1 % EX GEL: 2.0000 g | Freq: Four times a day (QID) | CUTANEOUS | 0 refills | Status: DC
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
## 2020-06-10 NOTE — ED Provider Notes (Signed)
[facility] EMERGENCY DEPARTMENT Provider Note   CSN: [id number] Arrival date & time: 06/10/20  0850     History Chief Complaint  Patient presents with  . Shoulder Pain    Deborah Hardy is a 35 y.o. female with no significant past medical history presenting to the ED with multiple complaints. First complaint is right shoulder pain.  Approximately 1 week ago states that she was wrestling and fell onto her right shoulder.  She has had persistent aching pain since then that only been minimally improved with Tylenol.  States that the pain will radiate to the back of her shoulder and down her right arm.  She is also concerned that "there is a bone sticking out of it."  She denies any prior fracture, dislocations or procedures in the area.  Denies chest pain, numbness or weakness. She also complains of cough for the past 2 days.  Reports cough productive with mucus.  She reports nausea and fatigue but denies any vomiting, shortness of breath, fever, sick contacts with similar symptoms.  HPI     Past Medical History:  Diagnosis Date  . Bipolar 1 disorder ([place])     There are no problems to display for this patient.   Past Surgical History:  Procedure Laterality Date  . BREAST SURGERY    . CHOLECYSTECTOMY    . TUBAL LIGATION       OB History   No obstetric history on file.     No family history on file.  Social History   Tobacco Use  . Smoking status: Current Every Day Smoker    Types: Cigars  . Smokeless tobacco: Never Used  Vaping Use  . Vaping Use: Unknown  Substance Use Topics  . Alcohol use: Yes    Comment: weekly  . Drug use: Yes    Types: Marijuana    Home Medications Prior to Admission medications   Medication Sig Start Date End Date Taking? Authorizing Provider  benzonatate (TESSALON) 100 MG capsule Take 1 capsule (100 mg total) by mouth every 8 (eight) hours. 06/10/20   Gearldine Looney, PA-C  diclofenac Sodium (VOLTAREN) 1 % GEL Apply 2 g  topically 4 (four) times daily. 06/10/20   Abrham Maslowski, PA-C  HYDROcodone-acetaminophen (NORCO/VICODIN) 5-325 MG tablet Take 1 tablet by mouth every 6 (six) hours as needed for severe pain. 02/10/19   Ripley Fraise, MD  naproxen (NAPROSYN) 500 MG tablet Take 1 tablet (500 mg total) by mouth 2 (two) times daily with a meal. 09/03/18   Caccavale, Sophia, PA-C    Allergies    Patient has no known allergies.  Review of Systems   Review of Systems  Constitutional: Positive for fatigue. Negative for appetite change, chills and fever.  HENT: Negative for ear pain, rhinorrhea, sneezing and sore throat.   Eyes: Negative for photophobia and visual disturbance.  Respiratory: Positive for cough. Negative for chest tightness, shortness of breath and wheezing.   Cardiovascular: Negative for chest pain and palpitations.  Gastrointestinal: Negative for abdominal pain, blood in stool, constipation, diarrhea, nausea and vomiting.  Genitourinary: Negative for dysuria, hematuria and urgency.  Musculoskeletal: Positive for arthralgias and myalgias.  Skin: Negative for rash.  Neurological: Negative for dizziness, weakness and light-headedness.    Physical Exam Updated Vital Signs BP 112/64 (BP Location: Right Arm)   Pulse (!) 57   Temp 98.3 F (36.8 C) (Oral)   Resp 16   Ht 5\' 1"  (1.549 m)   Wt 52.9 kg   LMP  05/10/2020   SpO2 100%   BMI 22.03 kg/m   Physical Exam Vitals and nursing note reviewed.  Constitutional:      General: She is not in acute distress.    Appearance: She is well-developed.     Comments: No signs of respiratory distress.  Speaking in complete sentences without difficulty.  HENT:     Head: Normocephalic and atraumatic.     Nose: Nose normal.  Eyes:     General: No scleral icterus.       Left eye: No discharge.     Conjunctiva/sclera: Conjunctivae normal.  Cardiovascular:     Rate and Rhythm: Normal rate and regular rhythm.     Heart sounds: Normal heart sounds. No  murmur heard.  No friction rub. No gallop.   Pulmonary:     Effort: Pulmonary effort is normal. No respiratory distress.     Breath sounds: Normal breath sounds.  Abdominal:     General: Bowel sounds are normal. There is no distension.     Palpations: Abdomen is soft.     Tenderness: There is no abdominal tenderness. There is no guarding.  Musculoskeletal:        General: Tenderness present. Normal range of motion.     Cervical back: Normal range of motion and neck supple.     Comments: Tenderness to palpation of the right shoulder diffusely limited range of motion secondary to pain.  2+ radial pulse palpated bilaterally.  Normal sensation to light touch.  Strength 5/5 in bilateral upper extremities.  Minimal tenderness palpation of the distal right clavicle.  Skin:    General: Skin is warm and dry.     Findings: No rash.  Neurological:     Mental Status: She is alert.     Motor: No abnormal muscle tone.     Coordination: Coordination normal.     ED Results / Procedures / Treatments   Labs (all labs ordered are listed, but only abnormal results are displayed) Labs Reviewed - No data to display  EKG None  Radiology DG Chest 2 View  Result Date: 06/10/2020 CLINICAL DATA:  Right shoulder pain after injury EXAM: CHEST - 2 VIEW COMPARISON:  09/02/2018 FINDINGS: The heart size and mediastinal contours are within normal limits. Both lungs are clear. The visualized skeletal structures are unremarkable. Surgical clips in the right upper quadrant. IMPRESSION: No active cardiopulmonary disease. Electronically Signed   By: Duanne Guess D.O.   On: 06/10/2020 12:29   DG Clavicle Right  Result Date: 06/10/2020 CLINICAL DATA:  Right shoulder and right clavicular pain after wrestling injury EXAM: RIGHT SHOULDER - 2+ VIEW; RIGHT CLAVICLE - 2+ VIEWS COMPARISON:  None. FINDINGS: There is no evidence of fracture or dislocation. Glenohumeral and acromioclavicular joints are within normal limits.  Alignment at the sternoclavicular joint appears unremarkable on frontal views. There is no evidence of arthropathy or other focal bone abnormality. Soft tissues are unremarkable. IMPRESSION: No acute osseous abnormality of the right shoulder or clavicle. Electronically Signed   By: Duanne Guess D.O.   On: 06/10/2020 12:27   DG Shoulder Right  Result Date: 06/10/2020 CLINICAL DATA:  Right shoulder and right clavicular pain after wrestling injury EXAM: RIGHT SHOULDER - 2+ VIEW; RIGHT CLAVICLE - 2+ VIEWS COMPARISON:  None. FINDINGS: There is no evidence of fracture or dislocation. Glenohumeral and acromioclavicular joints are within normal limits. Alignment at the sternoclavicular joint appears unremarkable on frontal views. There is no evidence of arthropathy or other focal bone abnormality.  Soft tissues are unremarkable. IMPRESSION: No acute osseous abnormality of the right shoulder or clavicle. Electronically Signed   By: Duanne Guess D.O.   On: 06/10/2020 12:27    Procedures Procedures (including critical care time)  Medications Ordered in ED Medications - No data to display  ED Course  I have reviewed the triage vital signs and the nursing notes.  Pertinent labs & imaging results that were available during my care of the patient were reviewed by me and considered in my medical decision making (see chart for details).    MDM Rules/Calculators/A&P                          35yo female with no significant past medical history presenting to the ED with multiple complaints. Right shoulder pain for the past week after wrestling.  There is tenderness palpation without any changes to range of motion although reports pain with movement.  No deformities noted.  Areas are vastly intact with normal strength.  X-ray shows no acute abnormalities.  Also reports productive cough for 2 days.  Reports fatigue and nausea.  Denies shortness of breath.  Lungs are clear on my exam.  Oxygen saturations 100%  on room air.  Chest x-ray is unremarkable.  Suspect that symptoms are viral in nature; suspect musculoskeletal cause of shoulder pain, doubt infectious or vascular cause.  Offered sling for shoulder pain and continued range of motion as well as NSAIDs and Voltaren gel.  Will give antitussives to help with cough.   All imaging, if done today, including plain films, CT scans, and ultrasounds, independently reviewed by me, and interpretations confirmed via formal radiology reads.  Patient is hemodynamically stable, in NAD, and able to ambulate in the ED. Evaluation does not show pathology that would require ongoing emergent intervention or inpatient treatment. I explained the diagnosis to the patient. Pain has been managed and has no complaints prior to discharge. Patient is comfortable with above plan and is stable for discharge at this time. All questions were answered prior to disposition. Strict return precautions for returning to the ED were discussed. Encouraged follow up with PCP.   An After Visit Summary was printed and given to the patient.   Portions of this note were generated with Scientist, clinical (histocompatibility and immunogenetics). Dictation errors may occur despite best attempts at proofreading.  Final Clinical Impression(s) / ED Diagnoses Final diagnoses:  Strain of right shoulder, initial encounter  Viral URI with cough    Rx / DC Orders ED Discharge Orders         Ordered    benzonatate (TESSALON) 100 MG capsule  Every 8 hours     Discontinue  Reprint     06/10/20 1257    diclofenac Sodium (VOLTAREN) 1 % GEL  4 times daily     Discontinue  Reprint     06/10/20 1257           Dietrich Pates, PA-C 06/10/20 1258    Pricilla Loveless, MD 06/10/20 1747

## 2020-06-10 NOTE — ED Notes (Signed)
Pt to xray

## 2020-06-10 NOTE — Discharge Instructions (Signed)
Use the Voltaren gel along with Tylenol ibuprofen to help with your symptoms. Take the Tessalon Perles to help with cough. Return to the ER for any worsening cough, shoulder pain, chest pain, shortness of breath.

## 2020-07-29 ENCOUNTER — Other Ambulatory Visit: Payer: Self-pay

## 2020-07-29 ENCOUNTER — Emergency Department (HOSPITAL_BASED_OUTPATIENT_CLINIC_OR_DEPARTMENT_OTHER)
Admission: EM | Admit: 2020-07-29 | Discharge: 2020-07-29 | Disposition: A | Discharge status: Home / Self Care | Payer: Medicaid Other | Attending: Emergency Medicine | Admitting: Emergency Medicine

## 2020-07-29 ENCOUNTER — Encounter (HOSPITAL_BASED_OUTPATIENT_CLINIC_OR_DEPARTMENT_OTHER): Payer: Self-pay | Admitting: Emergency Medicine

## 2020-07-29 DIAGNOSIS — F1590 Other stimulant use, unspecified, uncomplicated: Secondary | ICD-10-CM | POA: Insufficient documentation

## 2020-07-29 DIAGNOSIS — F17290 Nicotine dependence, other tobacco product, uncomplicated: Secondary | ICD-10-CM | POA: Diagnosis not present

## 2020-07-29 DIAGNOSIS — B9689 Other specified bacterial agents as the cause of diseases classified elsewhere: Secondary | ICD-10-CM | POA: Diagnosis not present

## 2020-07-29 DIAGNOSIS — N760 Acute vaginitis: Secondary | ICD-10-CM | POA: Insufficient documentation

## 2020-07-29 DIAGNOSIS — N898 Other specified noninflammatory disorders of vagina: Secondary | ICD-10-CM | POA: Diagnosis present

## 2020-07-29 DIAGNOSIS — Z711 Person with feared health complaint in whom no diagnosis is made: Secondary | ICD-10-CM | POA: Diagnosis not present

## 2020-07-29 LAB — URINALYSIS, ROUTINE W REFLEX MICROSCOPIC
Bilirubin Urine: NEGATIVE
Glucose, UA: NEGATIVE mg/dL
Hgb urine dipstick: NEGATIVE
Ketones, ur: NEGATIVE mg/dL
Leukocytes,Ua: NEGATIVE
Nitrite: NEGATIVE
Protein, ur: NEGATIVE mg/dL
Specific Gravity, Urine: >1.03 — ABNORMAL HIGH (ref 1.005–1.030)
pH: 6 (ref 5.0–8.0)

## 2020-07-29 LAB — PREGNANCY, URINE: Preg Test, Ur: NEGATIVE

## 2020-07-29 LAB — WET PREP, GENITAL
Sperm: NONE SEEN
Trich, Wet Prep: NONE SEEN
Yeast Wet Prep HPF POC: NONE SEEN

## 2020-07-29 MED ORDER — METRONIDAZOLE 500 MG PO TABS: 500.0000 mg | ORAL_TABLET | Freq: Two times a day (BID) | ORAL | 0 refills | Status: DC

## 2020-07-29 MED ORDER — METRONIDAZOLE 500 MG PO TABS: 2000.0000 mg | ORAL_TABLET | Freq: Once | ORAL | Status: DC

## 2020-07-29 MED ORDER — CEFTRIAXONE SODIUM 500 MG IJ SOLR
500.0000 mg | Freq: Once | INTRAMUSCULAR | Status: AC
  Administered 2020-07-29: 500 mg via INTRAMUSCULAR
  Filled 2020-07-29: qty 500

## 2020-07-29 MED ORDER — AZITHROMYCIN 1 G PO PACK
1.0000 g | PACK | Freq: Once | ORAL | Status: AC
  Administered 2020-07-29: 1 g via ORAL
  Filled 2020-07-29: qty 1

## 2020-07-29 NOTE — ED Triage Notes (Signed)
Pt reports vaginal discomfort and discharge x 3 days

## 2020-07-29 NOTE — ED Notes (Signed)
Sudden onset vaginal pain started on  7/27 some discharge noted.

## 2020-07-29 NOTE — ED Provider Notes (Signed)
[facility] Provider Note: Lowella Dell, MD, FACEP  CSN: [id number] MRN: [id number] ARRIVAL: 07/29/20 at 0328 ROOM: [facility]   CHIEF COMPLAINT  Vaginal Discharge   HISTORY OF PRESENT ILLNESS  07/29/20 5:15 AM Deborah Hardy is a 35 y.o. female with 3 days of vaginal discomfort and discharge.  She describes the discomfort as located in her vagina and rates it as a 4 out of 10, worse with sexual intercourse.  The pain is intermittent but came on fairly suddenly first.  She denies vaginal bleeding.  She is here with her husband who is here with some tingling at the end of his penis and is concerned he may have an STD.  We went ahead and treated him given his symptomatology.   Past Medical History:  Diagnosis Date  . Bipolar 1 disorder [facility])     Past Surgical History:  Procedure Laterality Date  . BREAST SURGERY    . CHOLECYSTECTOMY    . TUBAL LIGATION      No family history on file.  Social History   Tobacco Use  . Smoking status: Current Every Day Smoker    Types: Cigars  . Smokeless tobacco: Never Used  Vaping Use  . Vaping Use: Unknown  Substance Use Topics  . Alcohol use: Yes    Comment: weekly  . Drug use: Yes    Types: Marijuana    Prior to Admission medications   Medication Sig Start Date End Date Taking? Authorizing Provider  metroNIDAZOLE (FLAGYL) 500 MG tablet Take 1 tablet (500 mg total) by mouth 2 (two) times daily. One po bid x 7 days 07/29/20   Rochester Serpe, MD    Allergies Patient has no known allergies.   REVIEW OF SYSTEMS  Negative except as noted here or in the History of Present Illness.   PHYSICAL EXAMINATION  Initial Vital Signs Blood pressure (!) 101/63, pulse 62, temperature 98.4 F (36.9 C), temperature source Oral, resp. rate 16, height 5\' 1"  (1.549 m), weight 52.2 kg, SpO2 100 %.  Examination General: Well-developed, well-nourished female in no acute distress; appearance consistent with age of record HENT:  normocephalic; atraumatic Eyes: pupils equal, round and reactive to light; extraocular muscles intact Neck: supple Heart: regular rate and rhythm; no murmurs, rubs or gallops Lungs: clear to auscultation bilaterally Abdomen: soft; nondistended; nontender; bowel sounds present GU: Tanner V female; whitish following vaginal discharge; no vaginal bleeding; no cervical motion tenderness; no vulvovaginal tenderness; no adnexal tenderness Extremities: No deformity; full range of motion; pulses normal Neurologic: Awake, alert and oriented; motor function intact in all extremities and symmetric; no facial droop Skin: Warm and dry Psychiatric: Normal mood and affect   RESULTS  Summary of this visit's results, reviewed and interpreted by myself:   EKG Interpretation  Date/Time:    Ventricular Rate:    PR Interval:    QRS Duration:   QT Interval:    QTC Calculation:   R Axis:     Text Interpretation:        Laboratory Studies: Results for orders placed or performed during the hospital encounter of 07/29/20 (from the past 24 hour(s))  Urinalysis, Routine w reflex microscopic     Status: Abnormal   Collection Time: 07/29/20  3:45 AM  Result Value Ref Range   Color, Urine YELLOW YELLOW   APPearance HAZY (A) CLEAR   Specific Gravity, Urine >1.030 (H) 1.005 - 1.030   pH 6.0 5.0 - 8.0   Glucose, UA NEGATIVE NEGATIVE mg/dL  Hgb urine dipstick NEGATIVE NEGATIVE   Bilirubin Urine NEGATIVE NEGATIVE   Ketones, ur NEGATIVE NEGATIVE mg/dL   Protein, ur NEGATIVE NEGATIVE mg/dL   Nitrite NEGATIVE NEGATIVE   Leukocytes,Ua NEGATIVE NEGATIVE  Pregnancy, urine     Status: None   Collection Time: 07/29/20  3:45 AM  Result Value Ref Range   Preg Test, Ur NEGATIVE NEGATIVE  Wet prep, genital     Status: Abnormal   Collection Time: 07/29/20  5:16 AM  Result Value Ref Range   Yeast Wet Prep HPF POC NONE SEEN NONE SEEN   Trich, Wet Prep NONE SEEN NONE SEEN   Clue Cells Wet Prep HPF POC PRESENT (A)  NONE SEEN   WBC, Wet Prep HPF POC FEW (A) NONE SEEN   Sperm NONE SEEN    Imaging Studies: No results found.  ED COURSE and MDM  Nursing notes, initial and subsequent vitals signs, including pulse oximetry, reviewed and interpreted by myself.  Vitals:   07/29/20 0339 07/29/20 0342  BP:  (!) 101/63  Pulse:  62  Resp:  16  Temp:  98.4 F (36.9 C)  TempSrc:  Oral  SpO2:  100%  Weight: 52.2 kg   Height: 5\' 1"  (1.549 m)    Medications  cefTRIAXone (ROCEPHIN) injection 500 mg (has no administration in time range)  azithromycin (ZITHROMAX) powder 1 g (has no administration in time range)    We will treat for BV due to presence of clue cells and symptomatic vaginal discharge.  We will also treat for gonorrhea and chlamydia given that her husband was treated for the same.  She was advised of this and the need to be abstinent for 1 week after treatment.  PROCEDURES  Procedures   ED DIAGNOSES     ICD-10-CM   1. BV (bacterial vaginosis)  N76.0    B96.89   2. Concern about STD in female without diagnosis  Z71.1        Lanette Ell, , MD 07/29/20 [phone]

## 2020-08-01 LAB — GC/CHLAMYDIA PROBE AMP (~~LOC~~) NOT AT ARMC
Chlamydia: NEGATIVE
Comment: NEGATIVE
Comment: NORMAL
Neisseria Gonorrhea: NEGATIVE

## 2021-01-13 ENCOUNTER — Emergency Department (HOSPITAL_BASED_OUTPATIENT_CLINIC_OR_DEPARTMENT_OTHER)
Admission: EM | Admit: 2021-01-13 | Discharge: 2021-01-13 | Disposition: A | Discharge status: Home / Self Care | Payer: Medicaid Other | Attending: Emergency Medicine | Admitting: Emergency Medicine

## 2021-01-13 ENCOUNTER — Other Ambulatory Visit: Payer: Self-pay

## 2021-01-13 ENCOUNTER — Emergency Department (HOSPITAL_BASED_OUTPATIENT_CLINIC_OR_DEPARTMENT_OTHER): Payer: Medicaid Other

## 2021-01-13 ENCOUNTER — Encounter (HOSPITAL_BASED_OUTPATIENT_CLINIC_OR_DEPARTMENT_OTHER): Payer: Self-pay | Admitting: *Deleted

## 2021-01-13 DIAGNOSIS — Y9389 Activity, other specified: Secondary | ICD-10-CM | POA: Insufficient documentation

## 2021-01-13 DIAGNOSIS — Y9269 Other specified industrial and construction area as the place of occurrence of the external cause: Secondary | ICD-10-CM | POA: Diagnosis not present

## 2021-01-13 DIAGNOSIS — F17290 Nicotine dependence, other tobacco product, uncomplicated: Secondary | ICD-10-CM | POA: Diagnosis not present

## 2021-01-13 DIAGNOSIS — S22000A Wedge compression fracture of unspecified thoracic vertebra, initial encounter for closed fracture: Secondary | ICD-10-CM | POA: Diagnosis not present

## 2021-01-13 DIAGNOSIS — W52XXXA Crushed, pushed or stepped on by crowd or human stampede, initial encounter: Secondary | ICD-10-CM | POA: Diagnosis not present

## 2021-01-13 DIAGNOSIS — M546 Pain in thoracic spine: Secondary | ICD-10-CM | POA: Diagnosis present

## 2021-01-13 DIAGNOSIS — R52 Pain, unspecified: Secondary | ICD-10-CM

## 2021-01-13 MED ORDER — HYDROCODONE-ACETAMINOPHEN 5-325 MG PO TABS
1.0000 | ORAL_TABLET | Freq: Once | ORAL | Status: AC
  Administered 2021-01-13: 1 via ORAL
  Filled 2021-01-13: qty 1

## 2021-01-13 MED ORDER — HYDROCODONE-ACETAMINOPHEN 5-325 MG PO TABS: 1.0000 | ORAL_TABLET | Freq: Four times a day (QID) | ORAL | 0 refills | Status: DC | PRN

## 2021-01-13 NOTE — ED Notes (Signed)
In to round on client, warm blanket provided for comfort measures, explained radiological studies ordered.

## 2021-01-13 NOTE — ED Notes (Signed)
AVS reviewed with pt and reinforced information from PA, provided pt with work note as requested due to her job and that Neuro MD is recommending limited lifting and bending. Also informed pt of Rx for pain med had been sent to her pharmacy electronically, discussed safety while taking PO opioids. Opportunity for questions provided prior to leaving.
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
## 2021-01-13 NOTE — ED Notes (Signed)
Remains in ED for further evaluation of radiological findings (questionable compression fx) PA requesting neurological consult

## 2021-01-13 NOTE — Discharge Instructions (Signed)
Your CT scan x-ray shows that you have 3 compression fractures in your thoracic spine. I spoke to the oncall neurosurgeon, Dr. Maisie Fus, who recommends pain control and following up in his office in 4 weeks. He recommends avoiding excessive bending, twisting and avoiding lifting more than 15-20 lbs until you follow up with him for repeat imaging. Return to the ED sooner for additional injuries, worsening pain, numbness or weakness in arms, chest pain.

## 2022-02-04 ENCOUNTER — Encounter (HOSPITAL_COMMUNITY): Payer: Self-pay | Admitting: Emergency Medicine

## 2022-02-04 ENCOUNTER — Other Ambulatory Visit: Payer: Self-pay

## 2022-02-04 ENCOUNTER — Ambulatory Visit (HOSPITAL_COMMUNITY)
Admission: EM | Admit: 2022-02-04 | Discharge: 2022-02-04 | Disposition: A | Discharge status: Home / Self Care | Payer: Worker's Compensation

## 2022-02-04 DIAGNOSIS — M546 Pain in thoracic spine: Secondary | ICD-10-CM | POA: Diagnosis not present

## 2022-02-04 MED ORDER — MELOXICAM 15 MG PO TABS: 15.0000 mg | ORAL_TABLET | Freq: Every day | ORAL | 0 refills | Status: DC

## 2022-02-04 MED ORDER — TIZANIDINE HCL 4 MG PO TABS: 4.0000 mg | ORAL_TABLET | Freq: Four times a day (QID) | ORAL | 0 refills | Status: DC | PRN
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
## 2022-08-14 ENCOUNTER — Ambulatory Visit (HOSPITAL_COMMUNITY)
Admission: EM | Admit: 2022-08-14 | Discharge: 2022-08-14 | Disposition: A | Discharge status: Home / Self Care | Payer: Medicaid Other | Attending: Family Medicine | Admitting: Family Medicine

## 2022-08-14 ENCOUNTER — Encounter (HOSPITAL_COMMUNITY): Payer: Self-pay | Admitting: *Deleted

## 2022-08-14 DIAGNOSIS — Z202 Contact with and (suspected) exposure to infections with a predominantly sexual mode of transmission: Secondary | ICD-10-CM | POA: Diagnosis present

## 2022-08-14 LAB — HIV ANTIBODY (ROUTINE TESTING W REFLEX): HIV Screen 4th Generation wRfx: NONREACTIVE
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
## 2022-08-14 NOTE — ED Provider Notes (Signed)
[facility]    CSN: [id number] Arrival date & time: 08/14/22  1212      History   Chief Complaint Chief Complaint  Patient presents with   Exposure to STD    HPI Deborah Hardy is a 37 y.o. female.    Exposure to STD   Here for possible exposure to HIV.  She just broke up with someone whom she had been dating for about a year.  She states she had done STD screening sometime in 2022 and it was negative.  This was after she had started seeing this person.  Someone has just texted her and told her that her former partner is positive for HIV.  This patient has not had any vaginal discharge or abdominal pain or cough or fever or night sweats or weight loss  Past Medical History:  Diagnosis Date   Bipolar 1 disorder (HCC)     There are no problems to display for this patient.   Past Surgical History:  Procedure Laterality Date   BREAST SURGERY     CHOLECYSTECTOMY     TUBAL LIGATION      OB History   No obstetric history on file.      Home Medications    Prior to Admission medications   Medication Sig Start Date End Date Taking? Authorizing Provider  meloxicam (MOBIC) 15 MG tablet Take 1 tablet (15 mg total) by mouth daily. 02/04/22   Rushie Chestnut, PA-C  metroNIDAZOLE (FLAGYL) 500 MG tablet Take 1 tablet (500 mg total) by mouth 2 (two) times daily. One po bid x 7 days 07/29/20   Molpus, John, MD  naproxen (NAPROSYN) 375 MG tablet Take 375 mg by mouth 2 (two) times daily as needed. 02/03/22   [provider]  tiZANidine (ZANAFLEX) 4 MG tablet Take 1 tablet (4 mg total) by mouth every 6 (six) hours as needed for muscle spasms. 02/04/22   Rushie Chestnut, PA-C    Family History History reviewed. No pertinent family history.  Social History Social History   Tobacco Use   Smoking status: Former    Types: Cigars   Smokeless tobacco: Never  Vaping Use   Vaping Use: Every day  Substance Use Topics   Alcohol use: Yes    Comment:  occasionally   Drug use: Yes    Types: Marijuana     Allergies   Latex and Penicillins   Review of Systems Review of Systems   Physical Exam Triage Vital Signs ED Triage Vitals  Enc Vitals Group     BP 08/14/22 1251 92/67     Pulse Rate 08/14/22 1251 70     Resp 08/14/22 1251 16     Temp 08/14/22 1251 98.2 F (36.8 C)     Temp Source 08/14/22 1251 Oral     SpO2 08/14/22 1251 98 %     Weight --      Height --      Head Circumference --      Peak Flow --      Pain Score 08/14/22 1252 0     Pain Loc --      Pain Edu? --      Excl. in GC? --    No data found.  Updated Vital Signs BP 92/67   Pulse 70   Temp 98.2 F (36.8 C) (Oral)   Resp 16   LMP 08/05/2022 (Exact Date)   SpO2 98%   Visual Acuity Right Eye Distance:  Left Eye Distance:   Bilateral Distance:    Right Eye Near:   Left Eye Near:    Bilateral Near:     Physical Exam Vitals reviewed.  Constitutional:      General: She is not in acute distress.    Appearance: She is not ill-appearing, toxic-appearing or diaphoretic.  Cardiovascular:     Rate and Rhythm: Normal rate and regular rhythm.  Pulmonary:     Effort: Pulmonary effort is normal.     Breath sounds: Normal breath sounds.  Neurological:     Mental Status: She is alert.  Psychiatric:        Behavior: Behavior normal.      UC Treatments / Results  Labs (all labs ordered are listed, but only abnormal results are displayed) Labs Reviewed - No data to display  EKG   Radiology No results found.  Procedures Procedures (including critical care time)  Medications Ordered in UC Medications - No data to display  Initial Impression / Assessment and Plan / UC Course  I have reviewed the triage vital signs and the nursing notes.  Pertinent labs & imaging results that were available during my care of the patient were reviewed by me and considered in my medical decision making (see chart for details).     Lab drawn for HIV and  RPR, and self vaginal swab was done.  We will notify her of any positives and treat per protocol Final Clinical Impressions(s) / UC Diagnoses   Final diagnoses:  None   Discharge Instructions   None    ED Prescriptions   None    PDMP not reviewed this encounter.   Zenia Resides, MD 08/14/22 [phone]

## 2022-08-14 NOTE — Discharge Instructions (Addendum)
Staff will notify you if there is any positive test on your blood work or swab  You can also check MyChart

## 2022-08-15 ENCOUNTER — Telehealth (HOSPITAL_COMMUNITY): Payer: Self-pay | Admitting: Emergency Medicine

## 2022-08-15 LAB — CERVICOVAGINAL ANCILLARY ONLY
Bacterial Vaginitis (gardnerella): POSITIVE — AB
Candida Glabrata: NEGATIVE
Candida Vaginitis: NEGATIVE
Chlamydia: NEGATIVE
Comment: NEGATIVE
Comment: NEGATIVE
Comment: NEGATIVE
Comment: NEGATIVE
Comment: NEGATIVE
Comment: NORMAL
Neisseria Gonorrhea: NEGATIVE
Trichomonas: POSITIVE — AB

## 2022-08-15 LAB — RPR: RPR Ser Ql: NONREACTIVE

## 2022-08-15 MED ORDER — METRONIDAZOLE 500 MG PO TABS: 500.0000 mg | ORAL_TABLET | Freq: Two times a day (BID) | ORAL | 0 refills | Status: DC
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
## 2022-10-10 ENCOUNTER — Ambulatory Visit (HOSPITAL_COMMUNITY)
Admission: EM | Admit: 2022-10-10 | Discharge: 2022-10-10 | Disposition: A | Discharge status: Home / Self Care | Payer: Medicaid Other | Attending: Family Medicine | Admitting: Family Medicine

## 2022-10-10 ENCOUNTER — Encounter (HOSPITAL_COMMUNITY): Payer: Self-pay

## 2022-10-10 DIAGNOSIS — N898 Other specified noninflammatory disorders of vagina: Secondary | ICD-10-CM | POA: Diagnosis not present

## 2022-10-10 DIAGNOSIS — M25561 Pain in right knee: Secondary | ICD-10-CM | POA: Insufficient documentation

## 2022-10-10 DIAGNOSIS — M79604 Pain in right leg: Secondary | ICD-10-CM | POA: Insufficient documentation

## 2022-10-10 HISTORY — DX: Unspecified injury of unspecified lower leg, initial encounter: S89.90XA

## 2022-10-10 MED ORDER — PREDNISONE 20 MG PO TABS: 40.0000 mg | ORAL_TABLET | Freq: Every day | ORAL | 0 refills | Status: DC

## 2022-10-10 MED ORDER — METRONIDAZOLE 500 MG PO TABS: 500.0000 mg | ORAL_TABLET | Freq: Two times a day (BID) | ORAL | 0 refills | Status: DC
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
## 2022-10-10 NOTE — Discharge Instructions (Signed)
We have sent testing for various causes of infections. We will notify you of any positive results once they are received. If required, we will prescribe any medications you might need.

## 2022-10-11 LAB — CERVICOVAGINAL ANCILLARY ONLY
Bacterial Vaginitis (gardnerella): POSITIVE — AB
Candida Glabrata: NEGATIVE
Candida Vaginitis: NEGATIVE
Chlamydia: NEGATIVE
Comment: NEGATIVE
Comment: NEGATIVE
Comment: NEGATIVE
Comment: NEGATIVE
Comment: NEGATIVE
Comment: NORMAL
Neisseria Gonorrhea: NEGATIVE
Trichomonas: POSITIVE — AB

## 2023-07-16 ENCOUNTER — Emergency Department (HOSPITAL_BASED_OUTPATIENT_CLINIC_OR_DEPARTMENT_OTHER)
Admission: EM | Admit: 2023-07-16 | Discharge: 2023-07-16 | Disposition: A | Discharge status: Home / Self Care | Payer: 59 | Attending: Emergency Medicine | Admitting: Emergency Medicine

## 2023-07-16 ENCOUNTER — Other Ambulatory Visit: Payer: Self-pay

## 2023-07-16 DIAGNOSIS — B9689 Other specified bacterial agents as the cause of diseases classified elsewhere: Secondary | ICD-10-CM | POA: Insufficient documentation

## 2023-07-16 DIAGNOSIS — Z91040 Latex allergy status: Secondary | ICD-10-CM | POA: Diagnosis not present

## 2023-07-16 DIAGNOSIS — A5901 Trichomonal vulvovaginitis: Secondary | ICD-10-CM | POA: Diagnosis not present

## 2023-07-16 DIAGNOSIS — L739 Follicular disorder, unspecified: Secondary | ICD-10-CM | POA: Diagnosis not present

## 2023-07-16 DIAGNOSIS — A599 Trichomoniasis, unspecified: Secondary | ICD-10-CM | POA: Insufficient documentation

## 2023-07-16 DIAGNOSIS — N898 Other specified noninflammatory disorders of vagina: Secondary | ICD-10-CM | POA: Diagnosis not present

## 2023-07-16 DIAGNOSIS — N760 Acute vaginitis: Secondary | ICD-10-CM | POA: Insufficient documentation

## 2023-07-16 DIAGNOSIS — N390 Urinary tract infection, site not specified: Secondary | ICD-10-CM | POA: Diagnosis not present

## 2023-07-16 LAB — PREGNANCY, URINE: Preg Test, Ur: NEGATIVE

## 2023-07-16 LAB — URINALYSIS, ROUTINE W REFLEX MICROSCOPIC
Bilirubin Urine: NEGATIVE
Glucose, UA: NEGATIVE mg/dL
Hgb urine dipstick: NEGATIVE
Ketones, ur: NEGATIVE mg/dL
Leukocytes,Ua: NEGATIVE
Nitrite: POSITIVE — AB
Protein, ur: NEGATIVE mg/dL
Specific Gravity, Urine: 1.025 (ref 1.005–1.030)
pH: 6 (ref 5.0–8.0)

## 2023-07-16 LAB — URINALYSIS, MICROSCOPIC (REFLEX)

## 2023-07-16 LAB — WET PREP, GENITAL
Sperm: NONE SEEN
WBC, Wet Prep HPF POC: <10 (ref <10)
Yeast Wet Prep HPF POC: NONE SEEN

## 2023-07-16 LAB — HIV ANTIBODY (ROUTINE TESTING W REFLEX): HIV Screen 4th Generation wRfx: NONREACTIVE

## 2023-07-16 MED ORDER — CEPHALEXIN 500 MG PO CAPS: 500.0000 mg | ORAL_CAPSULE | Freq: Four times a day (QID) | ORAL | 0 refills | Status: DC

## 2023-07-16 MED ORDER — METRONIDAZOLE 500 MG PO TABS: 500.0000 mg | ORAL_TABLET | Freq: Two times a day (BID) | ORAL | 0 refills | Status: DC

## 2023-07-16 NOTE — Discharge Instructions (Signed)
Your workup today showed you have trichomonas, and bacterial vaginosis infection.  Antibiotics sent for these.  He also reports some burning.  Urine showed potential signs of infection.  Antibiotic sent for this as well.  For any concerning symptoms return to the emergency room otherwise follow-up with your primary care provider.

## 2023-07-16 NOTE — ED Triage Notes (Signed)
C/O white vaginal discharge and a couple of bumps that might be infected.

## 2023-07-16 NOTE — ED Provider Notes (Signed)
[place] [facility] Provider Note   CSN: [id number] Arrival date & time: 07/16/23  1404     History  Chief Complaint  Patient presents with   SEXUALLY TRANSMITTED DISEASE    Deborah Hardy is a 38 y.o. female.  38 year old female presents today for concern of STD exposure.  She states for the past 2 weeks she has noticed vaginal discharge.  No pelvic pain, dysuria, or flank pain.  Denies any recent change in sexual partner.  But states she did have unprotected sex recently.  States she is in a monogamous relationship.  She is also concerned that she may have folliculitis.  She states she has had drainage from lesions if she presses on them.  Otherwise no fever.  The history is provided by the patient. No language interpreter was used.       Home Medications Prior to Admission medications   Medication Sig Start Date End Date Taking? Authorizing Provider  metroNIDAZOLE (FLAGYL) 500 MG tablet Take 1 tablet (500 mg total) by mouth 2 (two) times daily. 10/10/22   Mardella Layman, MD  predniSONE (DELTASONE) 20 MG tablet Take 2 tablets (40 mg total) by mouth daily. 10/10/22   Mardella Layman, MD      Allergies    Latex and Penicillins    Review of Systems   Review of Systems  Constitutional:  Negative for fever.  Genitourinary:  Positive for vaginal discharge. Negative for dysuria, flank pain, pelvic pain, vaginal bleeding and vaginal pain.  Skin:  Positive for rash (Folliculitis over pubic region).  All other systems reviewed and are negative.   Physical Exam Updated Vital Signs BP 114/88 (BP Location: Right Arm)   Pulse (!) 59   Temp 98.2 F (36.8 C)   Resp 18   Ht 5\' 1"  (1.549 m)   Wt 54 kg   SpO2 100%   BMI 22.48 kg/m  Physical Exam Vitals and nursing note reviewed.  Constitutional:      General: She is not in acute distress.    Appearance: Normal appearance. She is not ill-appearing.  HENT:     Head: Normocephalic and  atraumatic.     Nose: Nose normal.  Eyes:     Conjunctiva/sclera: Conjunctivae normal.  Pulmonary:     Effort: Pulmonary effort is normal. No respiratory distress.  Genitourinary:    Comments: Nurse Victorino Dike present as chaperone.  Lesions may be consistent with colitis noted over the pubic area.  No drainage.  No significant.  Speculum exam deferred.  Patient will self swab. Musculoskeletal:        General: No deformity.  Skin:    Findings: No rash.  Neurological:     Mental Status: She is alert.     ED Results / Procedures / Treatments   Labs (all labs ordered are listed, but only abnormal results are displayed) Labs Reviewed  URINALYSIS, ROUTINE W REFLEX MICROSCOPIC - Abnormal; Notable for the following components:      Result Value   APPearance CLOUDY (*)    Nitrite POSITIVE (*)    All other components within normal limits  URINALYSIS, MICROSCOPIC (REFLEX) - Abnormal; Notable for the following components:   Bacteria, UA RARE (*)    All other components within normal limits  WET PREP, GENITAL  PREGNANCY, URINE  RPR  HIV ANTIBODY (ROUTINE TESTING W REFLEX)  GC/CHLAMYDIA PROBE AMP ([place]) NOT AT [facility]    EKG None  Radiology No results found.  Procedures Procedures  Medications Ordered in ED Medications - No data to display  ED Course/ Medical Decision Making/ A&P                             Medical Decision Making Amount and/or Complexity of Data Reviewed Labs: ordered.  Risk Prescription drug management.   38 year old female presents today for concern of STD exposure.  Self swab obtained.  BV and trichomoniasis positive.  Flagyl prescribed.  The end of the visit patient also reports dysuria.  She initially denied this.  Given the UA findings we will start her on Keflex as well.  Some folliculitis noted.  Advised her that the rest of the STI panel will take a few days to result.  If these are positive she will get a call and treated appropriately at  that time.  Patient is appropriate for discharge.  Discharged in stable condition.  Return precaution discussed.  Patient voices understanding and is in agreement with plan.   Final Clinical Impression(s) / ED Diagnoses Final diagnoses:  Trichomoniasis  BV (bacterial vaginosis)  Urinary tract infection without hematuria, site unspecified    Rx / DC Orders ED Discharge Orders          Ordered    cephALEXin (KEFLEX) 500 MG capsule  4 times daily        07/16/23 1840    metroNIDAZOLE (FLAGYL) 500 MG tablet  2 times daily        07/16/23 1840              Marita Kansas, PA-C 07/16/23 1851    Linwood Dibbles, MD 07/17/23 1554

## 2023-07-16 NOTE — ED Notes (Signed)
Pelvic cart outside door and ready

## 2023-07-17 LAB — GC/CHLAMYDIA PROBE AMP (~~LOC~~) NOT AT ARMC
Chlamydia: NEGATIVE
Comment: NEGATIVE
Comment: NORMAL
Neisseria Gonorrhea: NEGATIVE

## 2023-07-17 LAB — RPR: RPR Ser Ql: NONREACTIVE

## 2023-10-17 DIAGNOSIS — Z202 Contact with and (suspected) exposure to infections with a predominantly sexual mode of transmission: Secondary | ICD-10-CM | POA: Diagnosis not present

## 2023-10-27 ENCOUNTER — Ambulatory Visit (HOSPITAL_COMMUNITY)
Admission: EM | Admit: 2023-10-27 | Discharge: 2023-10-28 | Disposition: A | Discharge status: Home / Self Care | Payer: 59

## 2023-10-27 DIAGNOSIS — Z760 Encounter for issue of repeat prescription: Secondary | ICD-10-CM | POA: Diagnosis not present

## 2023-10-27 DIAGNOSIS — F3132 Bipolar disorder, current episode depressed, moderate: Secondary | ICD-10-CM

## 2023-10-27 DIAGNOSIS — F3177 Bipolar disorder, in partial remission, most recent episode mixed: Secondary | ICD-10-CM | POA: Diagnosis not present

## 2023-10-27 NOTE — Discharge Instructions (Signed)

## 2023-10-27 NOTE — BH Assessment (Signed)
Clinician provided and explained the [facility] walk-in clinic hours and encouraged her to come in the morning to restart her medications and for therapy.    Redmond Pulling, MS, [facility], [facility] Triage Specialist

## 2023-10-27 NOTE — ED Notes (Signed)
Patient is being given the after visit summary and escorted to waiting room.

## 2023-10-27 NOTE — ED Provider Notes (Incomplete)
Behavioral Health Urgent Care Medical Screening Exam  Patient Name: Deborah Hardy MRN: [id number] Date of Evaluation: 10/27/23 Chief Complaint:  I want to restart medications Diagnosis:  Final diagnoses:  Bipolar affective disorder, currently depressed, moderate (HCC)    History of Present illness: Deborah Hardy is a 38 y.o. female    Flowsheet Row ED from 07/16/2023 in [facility] Emergency Department at [facility] ED from 10/10/2022 in [facility] Care at [facility] ED from 08/14/2022 in [facility] Urgent Care at [facility] RISK CATEGORY No Risk No Risk No Risk       Psychiatric Specialty Exam  Presentation  General Appearance:Casual  Eye Contact:Good  Speech:Clear and Coherent  Speech Volume:Normal  Handedness:Right   Mood and Affect  Mood: Depressed  Affect: Appropriate   Thought Process  Thought Processes: Coherent  Descriptions of Associations:Intact  Orientation:Full (Time, Place and Person)  Thought Content:WDL    Hallucinations:None  Ideas of Reference:None  Suicidal Thoughts:No  Homicidal Thoughts:No   Sensorium  Memory: Immediate Fair; Recent Fair; Remote Fair  Judgment: Good  Insight: Good   Executive Functions  Concentration: Good  Attention Span: Good  Recall: Good  Fund of Knowledge: Good  Language: Good   Psychomotor Activity  Psychomotor Activity: Normal   Assets  Assets: Communication Skills; Desire for Improvement; Physical Health; Resilience   Sleep  Sleep: Good  Number of hours:  8   Physical Exam: Physical Exam Review of Systems  Constitutional: Negative.   HENT: Negative.    Eyes: Negative.   Respiratory: Negative.    Cardiovascular: Negative.   Gastrointestinal: Negative.   Genitourinary: Negative.   Musculoskeletal: Negative.   Skin: Negative.   Neurological: Negative.   Endo/Heme/Allergies: Negative.   Psychiatric/Behavioral:  Positive for  depression.    Blood pressure 121/71, pulse 76, temperature 98.1 F (36.7 C), temperature source Oral, resp. rate 18, SpO2 100%. There is no height or weight on file to calculate BMI.  Musculoskeletal: Strength & Muscle Tone: within normal limits Gait & Station: normal Patient leans: N/A   [facility] MSE Discharge Disposition for Follow up and Recommendations: Based on my evaluation the patient does not appear to have an emergency medical condition and can be discharged with resources and follow up care in outpatient services for Medication Management and Individual Therapy   Jasper Riling, NP 10/27/2023, 11:20 PM
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
## 2023-10-28 DIAGNOSIS — F3132 Bipolar disorder, current episode depressed, moderate: Secondary | ICD-10-CM

## 2024-01-29 ENCOUNTER — Ambulatory Visit (INDEPENDENT_AMBULATORY_CARE_PROVIDER_SITE_OTHER): Payer: 59 | Admitting: Student

## 2024-01-29 VITALS — BP 111/76 | HR 66 | Ht 61.5 in | Wt 135.4 lb

## 2024-01-29 DIAGNOSIS — Z59819 Housing instability, housed unspecified: Secondary | ICD-10-CM | POA: Insufficient documentation

## 2024-01-29 DIAGNOSIS — Z0000 Encounter for general adult medical examination without abnormal findings: Secondary | ICD-10-CM

## 2024-01-29 DIAGNOSIS — F250 Schizoaffective disorder, bipolar type: Secondary | ICD-10-CM | POA: Diagnosis not present

## 2024-01-29 DIAGNOSIS — F4310 Post-traumatic stress disorder, unspecified: Secondary | ICD-10-CM

## 2024-01-29 MED ORDER — PRAZOSIN HCL 1 MG PO CAPS: 1.0000 mg | ORAL_CAPSULE | Freq: Every day | ORAL | 1 refills | Status: AC

## 2024-01-29 MED ORDER — ARIPIPRAZOLE 5 MG PO TABS: 5.0000 mg | ORAL_TABLET | Freq: Every day | ORAL | 1 refills | Status: AC

## 2024-01-29 NOTE — Patient Instructions (Addendum)
Start Abilify 5 mg 1/30 AM Start Prazosin 1 mg 2/6 PM    [facility]  URGENT CARE: Open 24 hours per day for acute and/or urgent behavioral health concerns.   OUTPATIENT Walk-in information:  Please note, all walk-ins are first come & first serve, with limited number of availability. Therapist for therapy:  Monday, Tuesday, Wednesday & Thursday mornings Please ARRIVE at 7:00 AM for registration Will START at 8:00 AM Every 1st, 2nd & 3rd Friday of the month: Please ARRIVE at 7:00 AM for registration Will START at 1 PM - 5 PM Psychiatrist for medication management: Monday - Friday:  Please ARRIVE at 7:00 AM for registration Will START at 8:00 AM Appointments times are as follows:  - New patients get 1 hr ex: 8-9 am 9-10 & 10-11 then that's it.  - Existing pt's that are not seeing their provider will still take 1hr as they would be new to the provider that is covering walk ins that day.  - Existing follow ups get 30 mins.... (if they have been seen by the provider covering walk ins that day.)    ** Thursday or Friday for therapy.     Regretfully, due to limited availability, please be aware that you may not been seen on the same day as walk-in. Please consider making an appoint or try again. Thank you for your patience and understanding.  [facility] [place] [address] [place], [place] [address] [phone]  New patients are seen at their walk-in clinic. Walk-in hours are Monday - Friday from 8:30 am - 12:00 pm, and from 1:00 pm - 2:30 pm.   Walk-in patients are seen on a first come, first served basis, so try to arrive as early as possible for the best chance of being seen the same day.
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
## 2024-02-03 ENCOUNTER — Encounter (HOSPITAL_COMMUNITY): Payer: Self-pay | Admitting: Student
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
## 2024-02-04 ENCOUNTER — Telehealth: Payer: Self-pay | Admitting: *Deleted

## 2024-02-04 ENCOUNTER — Telehealth (HOSPITAL_COMMUNITY): Payer: Self-pay

## 2024-02-04 ENCOUNTER — Telehealth (HOSPITAL_COMMUNITY): Payer: Self-pay | Admitting: Student

## 2024-02-04 DIAGNOSIS — Z59819 Housing instability, housed unspecified: Secondary | ICD-10-CM

## 2024-02-04 NOTE — Progress Notes (Signed)
 Complex Care Management Note Care Guide Note  02/04/2024 Name: [name] MRN: [id number] DOB: [birth year]  Deborah Hardy is a 39 y.o. year old female who is a primary care patient of Dough, Lamar CROME, MD . The community resource team was consulted for assistance with Transportation Needs , Food Insecurity, and Financial Difficulties related to Rent   SDOH screenings and interventions completed:  Yes  Social Drivers of Health From This Encounter   Food Insecurity: Food Insecurity Present (02/04/2024)   Hunger Vital Sign    Worried About Running Out of Food in the Last Year: Sometimes true    Ran Out of Food in the Last Year: Sometimes true  Housing: High Risk (02/04/2024)   Housing Stability Vital Sign    Unable to Pay for Housing in the Last Year: Yes    Number of Times Moved in the Last Year: 0    Homeless in the Last Year: No  Transportation Needs: No Transportation Needs (02/04/2024)   PRAPARE - Administrator, Civil Service (Medical): No    Lack of Transportation (Non-Medical): No  Utilities: Not At Risk (02/04/2024)   Utilities    Threatened with loss of utilities: No    SDOH Interventions Today    Flowsheet Row Most Recent Value  SDOH Interventions   Food Insecurity Interventions AMB Referral, Community Resources Provided, [id number] Referral  [Gave [place] [employer] . as well as a [place] [facility]]  Housing Interventions AMB Referral, [employer] Provided, [id number] Referral  [Depression has set her back]  Transportation Interventions [employer] Provided, SCAT (Specialized Community Area Transporation)  Utilities Interventions AMB Referral       Called patient and put in her referral in  ot various [place] [employer] things and also called [employer] and for help with rent and also food transportation benefits Care guide performed the following interventions: Patient provided with information about care guide support team and interviewed to  confirm resource needs.  Follow Up Plan:  No further follow up planned at this time. The patient has been provided with needed resources.  Encounter Outcome:  Patient Visit Completed  [phone] is the call reference . [phone] is the care manager   Asencion Gell  [facility] Health Care Guide  Direct Dial:[phone] [phone] Website: [email or web address]

## 2024-02-04 NOTE — Telephone Encounter (Signed)
 Patient's VBCI referral was denied. Called [phone]) and spoke to Leita, who advised that patient would need to have a primary care provider who is in the VBCI network before it could be approved.   I was then transferred to [facility], who confirmed [facility] managed Medicaid coverage. She will call [facility] to follow up on PCP referral.   Update: Will be able to connect to community resources but not to child psychotherapist due to [place]. She will also get PCP referral.  Charmaine Myrtle, MD PGY-3 02/04/2024  10:55 AM [facility] Res[facility]

## 2024-02-04 NOTE — Telephone Encounter (Signed)
Medication management - prior authorization for pt's prescribed Abilify 5 mg tablets completed online with covermymeds and sent to patients [facility] Tailored Plan, to [employer] for review and decision pending.

## 2024-02-04 NOTE — Telephone Encounter (Signed)
 Thank you :)

## 2024-02-12 ENCOUNTER — Telehealth (HOSPITAL_COMMUNITY): Payer: Self-pay | Admitting: *Deleted

## 2024-02-12 NOTE — Telephone Encounter (Signed)
Fax received for PA of Aripiprazole 5mg . Unable to find previous request on Cover my Meds, Submitted and waiting on decision.

## 2024-02-13 ENCOUNTER — Telehealth (HOSPITAL_COMMUNITY): Payer: Self-pay | Admitting: *Deleted

## 2024-02-13 NOTE — Telephone Encounter (Signed)
Fax received for approval of Aripiprazole 5mg  until 02/11/25. Called to notify pharmacy.

## 2024-02-25 ENCOUNTER — Encounter (HOSPITAL_COMMUNITY): Payer: MEDICAID | Admitting: Student

## 2024-03-11 ENCOUNTER — Encounter (HOSPITAL_COMMUNITY): Payer: 59 | Admitting: Student

## 2024-04-13 ENCOUNTER — Encounter: Payer: 59 | Admitting: Family

## 2024-04-13 NOTE — Progress Notes (Signed)
 Erroneous encounter-disregard

## 2024-08-09 ENCOUNTER — Encounter (HOSPITAL_COMMUNITY): Payer: Self-pay

## 2024-08-09 ENCOUNTER — Ambulatory Visit (HOSPITAL_COMMUNITY)
Admission: EM | Admit: 2024-08-09 | Discharge: 2024-08-09 | Disposition: A | Discharge status: Home / Self Care | Payer: MEDICAID | Attending: Nurse Practitioner | Admitting: Nurse Practitioner

## 2024-08-09 DIAGNOSIS — N898 Other specified noninflammatory disorders of vagina: Secondary | ICD-10-CM | POA: Insufficient documentation

## 2024-08-09 DIAGNOSIS — R238 Other skin changes: Secondary | ICD-10-CM | POA: Insufficient documentation

## 2024-08-09 DIAGNOSIS — Z113 Encounter for screening for infections with a predominantly sexual mode of transmission: Secondary | ICD-10-CM | POA: Diagnosis not present

## 2024-08-09 LAB — POCT URINE PREGNANCY: Preg Test, Ur: NEGATIVE

## 2024-08-09 MED ORDER — METRONIDAZOLE 500 MG PO TABS: 500.0000 mg | ORAL_TABLET | Freq: Two times a day (BID) | ORAL | 0 refills | Status: AC

## 2024-08-09 MED ORDER — FLUCONAZOLE 150 MG PO TABS: 150.0000 mg | ORAL_TABLET | ORAL | 0 refills | Status: AC

## 2024-08-09 NOTE — Discharge Instructions (Addendum)
 You were seen today for a crawling sensation under the skin of your face and for vaginal discharge. No signs of hookworm or other parasitic skin infestation were found on examination, and your symptoms do not match typical presentations for these conditions. Information about hookworm infection is attached for your review.  You should call in the morning to schedule an appointment with a dermatologist for further evaluation. Wash the area gently with mild, fragrance-free soap, avoid scratching, and monitor for any changes such as redness, swelling, drainage, or pain. You were provided with a specimen cup to collect any future skin debris and are encouraged to take pictures or videos if the symptoms recur for documentation.  You also reported white, thick vaginal discharge. A self-swab was done today to test for bacterial vaginosis, yeast infection, trichomonas, gonorrhea, and chlamydia. You were prescribed medication to treat possible BV and yeast infection. Do not have sexual contact until your results are available and any necessary treatment is completed.  You will only be notified if any of your results are positive; you may review your results at any time on your MyChart.  Return to the clinic or seek medical care if you develop fever, spreading redness, severe pain, or drainage from the skin, or if vaginal symptoms worsen, you develop pelvic or abdominal pain, abnormal bleeding, or fever. Seek emergency care immediately if you develop rapid swelling of the face, difficulty breathing, or any other sudden, severe symptoms.

## 2024-08-09 NOTE — ED Notes (Signed)
 Patient states she thinks she has a hook worm under her skin on her face and under her chin and noticed it this morning.  Patient states that she has been using rubbing alcohol on the areas.

## 2024-08-09 NOTE — ED Provider Notes (Signed)
 [facility]    CSN: [id number] Arrival date & time: 08/09/24  1443      History   Chief Complaint No chief complaint on file.   HPI Deborah Hardy is a 39 y.o. female.   Discussed the use of AI scribe software for clinical note transcription with the patient, who gave verbal consent to proceed.   The patient presents with a feeling as if something crawling underneath the skin on her face. She reports that this morning, she observed something coming out of her skin, which she thinks might be a hookworm given her Landscape [employer]. She denies recent travel outside the country, exposure to soil with bare skin, swelling, redness, or fever. She mentions experiencing insect bites thought to be mosquitos on her legs. No similar symptoms have occurred in the past. No history of skin conditions in the affected area. The patient denies any previous occurrences of the skin issue and states she has never experienced anything like this before. She attempted to clean the affected area with alcohol, which resulted in white drainage from the left lower part of her face.   In addition to the skin concern, the patient mentions experiencing vaginal discharge. She describes the discharge as white and thick, without any associated odor, itching, or irritation. The patient is sexually active with two female partners within the past three months and reports using condoms sometimes.  The following portions of the patient's history were reviewed and updated as appropriate: allergies, current medications, past family history, past medical history, past social history, past surgical history, and problem list.    Past Medical History:  Diagnosis Date   Bipolar 1 disorder (HCC)    Injury of medial collateral ligament (MCL) of knee    right    Patient Active Problem List   Diagnosis Date Noted   PTSD (post-traumatic stress disorder) 01/29/2024   Schizoaffective disorder, bipolar type (HCC)  01/29/2024   Housing instability 01/29/2024    Past Surgical History:  Procedure Laterality Date   BREAST SURGERY     CHOLECYSTECTOMY     TUBAL LIGATION      OB History   No obstetric history on file.      Home Medications    Prior to Admission medications   Medication Sig Start Date End Date Taking? Authorizing Provider  fluconazole  (DIFLUCAN ) 150 MG tablet Take 1 tablet (150 mg total) by mouth every 3 (three) days for 2 doses. 08/09/24 08/13/24 Yes Iola Lukes, FNP  metroNIDAZOLE  (FLAGYL ) 500 MG tablet Take 1 tablet (500 mg total) by mouth 2 (two) times daily for 7 days. 08/09/24 08/16/24 Yes Iola Lukes, FNP  ARIPiprazole  (ABILIFY ) 5 MG tablet Take 1 tablet (5 mg total) by mouth daily. 01/29/24 03/29/24  Barbra Jayson LABOR, MD  prazosin  (MINIPRESS ) 1 MG capsule Take 1 capsule (1 mg total) by mouth at bedtime. 02/06/24 04/06/24  Barbra Jayson LABOR, MD    Family History History reviewed. No pertinent family history.  Social History Social History   Tobacco Use   Smoking status: Some Days    Current packs/day: 0.50    Types: E-cigarettes, Cigarettes   Smokeless tobacco: Never  Vaping Use   Vaping status: Former  Substance Use Topics   Alcohol use: Yes    Alcohol/week: 3.0 standard drinks of alcohol    Types: 3 Standard drinks or equivalent per week    Comment: occasionally; weekends   Drug use: Yes    Types: Marijuana     Allergies  Latex and Penicillins   Review of Systems Review of Systems  Gastrointestinal:  Negative for nausea and vomiting.  Genitourinary:  Positive for vaginal discharge. Negative for dysuria and menstrual problem.  All other systems reviewed and are negative.    Physical Exam Triage Vital Signs ED Triage Vitals  Encounter Vitals Group     BP 08/09/24 1555 100/62     Girls Systolic BP Percentile --      Girls Diastolic BP Percentile --      Boys Systolic BP Percentile --      Boys Diastolic BP Percentile --      Pulse Rate  08/09/24 1555 73     Resp 08/09/24 1555 14     Temp 08/09/24 1555 98.1 F (36.7 C)     Temp Source 08/09/24 1555 Oral     SpO2 08/09/24 1555 100 %     Weight --      Height --      Head Circumference --      Peak Flow --      Pain Score 08/09/24 1558 5     Pain Loc --      Pain Education --      Exclude from Growth Chart --    No data found.  Updated Vital Signs BP 100/62 (BP Location: Right Arm)   Pulse 73   Temp 98.1 F (36.7 C) (Oral)   Resp 14   LMP 07/08/2024 (Approximate)   SpO2 100%   Visual Acuity Right Eye Distance:   Left Eye Distance:   Bilateral Distance:    Right Eye Near:   Left Eye Near:    Bilateral Near:     Physical Exam Constitutional:      General: She is not in acute distress.    Appearance: Normal appearance. She is not ill-appearing, toxic-appearing or diaphoretic.  HENT:     Head: Normocephalic.     Nose: Nose normal.     Mouth/Throat:     Mouth: Mucous membranes are moist.  Eyes:     Conjunctiva/sclera: Conjunctivae normal.  Cardiovascular:     Rate and Rhythm: Normal rate.  Pulmonary:     Effort: Pulmonary effort is normal.  Abdominal:     Palpations: Abdomen is soft.  Genitourinary:    Comments: Deferred; patient performed self-swab for Aptima testing  Musculoskeletal:        General: Normal range of motion.     Cervical back: Normal range of motion and neck supple.  Skin:    General: Skin is warm and dry.     Comments:  No redness or swelling of the face. No rash, lesions, swelling, redness or visible burrows noted anywhere on the face.   Neurological:     General: No focal deficit present.     Mental Status: She is alert and oriented to person, place, and time.  Psychiatric:        Mood and Affect: Mood normal.        Behavior: Behavior normal.      UC Treatments / Results  Labs (all labs ordered are listed, but only abnormal results are displayed) Labs Reviewed  POCT URINE PREGNANCY  CERVICOVAGINAL ANCILLARY ONLY     EKG   Radiology No results found.  Procedures Procedures (including critical care time)  Medications Ordered in UC Medications - No data to display  Initial Impression / Assessment and Plan / UC Course  I have reviewed the triage vital signs and the nursing  notes.  Pertinent labs & imaging results that were available during my care of the patient were reviewed by me and considered in my medical decision making (see chart for details).     Patient presents with concern for parasitic skin infestation, reporting a crawling sensation under the skin of her face and visible movement after cleansing with alcohol. She suspects hookworm based on internet research. She denies recent travel, soil exposure, swelling, redness, or fever. She showed a video of a white substance at the skin's surface, though no active movement was observed. On examination, no visible burrows or lesions consistent with hookworm infection were present. Presentation is not consistent with typical hookworm manifestations. She was advised to arrange a dermatology appointment for further evaluation, use gentle cleansing with non-fragrant soap, avoid scratching, and monitor for changes. A specimen cup was provided for any further debris, and she was encouraged to photograph or video future occurrences.  She also reports white, thick vaginal discharge without odor, itching, or irritation. She is sexually active with inconsistent condom use. Self-collected vaginal swab was obtained for BV, yeast, gonorrhea, and chlamydia testing. Empiric treatment for BV and yeast infection was prescribed, and she was instructed to abstain from sexual activity until results are available and any needed treatment is completed. Results will be released via MyChart with further recommendations as indicated. She was advised to follow up with her PCP or return sooner for worsening symptoms, new skin lesions, pelvic pain, fever, or abnormal  bleeding.  Today's evaluation has revealed no signs of a dangerous process. Discussed diagnosis with patient and/or guardian. Patient and/or guardian aware of their diagnosis, possible red flag symptoms to watch out for and need for close follow up. Patient and/or guardian understands verbal and written discharge instructions. Patient and/or guardian comfortable with plan and disposition.  Patient and/or guardian has a clear mental status at this time, good insight into illness (after discussion and teaching) and has clear judgment to make decisions regarding their care  Documentation was completed with the aid of voice recognition software. Transcription may contain typographical errors. Final Clinical Impressions(s) / UC Diagnoses   Final diagnoses:  Skin irritation  Vaginal discharge  Screening examination for STD (sexually transmitted disease)     Discharge Instructions      You were seen today for a crawling sensation under the skin of your face and for vaginal discharge. No signs of hookworm or other parasitic skin infestation were found on examination, and your symptoms do not match typical presentations for these conditions. Information about hookworm infection is attached for your review.  You should call in the morning to schedule an appointment with a dermatologist for further evaluation. Wash the area gently with mild, fragrance-free soap, avoid scratching, and monitor for any changes such as redness, swelling, drainage, or pain. You were provided with a specimen cup to collect any future skin debris and are encouraged to take pictures or videos if the symptoms recur for documentation.  You also reported white, thick vaginal discharge. A self-swab was done today to test for bacterial vaginosis, yeast infection, trichomonas, gonorrhea, and chlamydia. You were prescribed medication to treat possible BV and yeast infection. Do not have sexual contact until your results are available and  any necessary treatment is completed.  You will only be notified if any of your results are positive; you may review your results at any time on your MyChart.  Return to the clinic or seek medical care if you develop fever, spreading redness, severe  pain, or drainage from the skin, or if vaginal symptoms worsen, you develop pelvic or abdominal pain, abnormal bleeding, or fever. Seek emergency care immediately if you develop rapid swelling of the face, difficulty breathing, or any other sudden, severe symptoms.      ED Prescriptions     Medication Sig Dispense Auth. Provider   fluconazole  (DIFLUCAN ) 150 MG tablet Take 1 tablet (150 mg total) by mouth every 3 (three) days for 2 doses. 2 tablet Iola Lukes, FNP   metroNIDAZOLE  (FLAGYL ) 500 MG tablet Take 1 tablet (500 mg total) by mouth 2 (two) times daily for 7 days. 14 tablet Iola Lukes, FNP      PDMP not reviewed this encounter.   Iola Lukes, [place] 08/09/24 1734

## 2024-08-10 ENCOUNTER — Ambulatory Visit: Payer: Self-pay

## 2024-08-10 LAB — CERVICOVAGINAL ANCILLARY ONLY
Bacterial Vaginitis (gardnerella): POSITIVE — AB
Candida Glabrata: NEGATIVE
Candida Vaginitis: NEGATIVE
Chlamydia: NEGATIVE
Comment: NEGATIVE
Comment: NEGATIVE
Comment: NEGATIVE
Comment: NEGATIVE
Comment: NEGATIVE
Comment: NORMAL
Neisseria Gonorrhea: NEGATIVE
Trichomonas: POSITIVE — AB

## 2024-08-18 NOTE — Telephone Encounter (Signed)
 Pt called after received letter.  Advised of results. Denies questions. Phone number updated.
